# Patient Record
Sex: Male | Born: 1935 | Race: White | Hispanic: No | Marital: Married | Smoking: Former smoker
Health system: Southern US, Community
[De-identification: ages and names within clinical notes are randomized; demographics above are authoritative.]

## PROBLEM LIST (undated history)

## (undated) DIAGNOSIS — N200 Calculus of kidney: Secondary | ICD-10-CM

## (undated) DIAGNOSIS — I10 Essential (primary) hypertension: Secondary | ICD-10-CM

## (undated) DIAGNOSIS — E7800 Pure hypercholesterolemia, unspecified: Secondary | ICD-10-CM

## (undated) DIAGNOSIS — I82409 Acute embolism and thrombosis of unspecified deep veins of unspecified lower extremity: Secondary | ICD-10-CM

## (undated) DIAGNOSIS — E039 Hypothyroidism, unspecified: Secondary | ICD-10-CM

## (undated) DIAGNOSIS — H353211 Exudative age-related macular degeneration, right eye, with active choroidal neovascularization: Secondary | ICD-10-CM

## (undated) HISTORY — DX: Essential (primary) hypertension: I10

## (undated) HISTORY — DX: Exudative age-related macular degeneration, right eye, with active choroidal neovascularization: H35.3211

## (undated) HISTORY — PX: COLONOSCOPY: SHX174

## (undated) HISTORY — DX: Hypothyroidism, unspecified: E03.9

## (undated) HISTORY — PX: OTHER SURGICAL HISTORY: SHX169

## (undated) HISTORY — PX: TOTAL HIP ARTHROPLASTY: SHX124

## (undated) HISTORY — PX: CATARACT EXTRACTION W/ INTRAOCULAR LENS IMPLANT: SHX1309

## (undated) HISTORY — DX: Pure hypercholesterolemia, unspecified: E78.00

---

## 1982-02-08 HISTORY — PX: HEMORROIDECTOMY: SUR656

## 2006-02-08 DIAGNOSIS — H269 Unspecified cataract: Secondary | ICD-10-CM

## 2006-02-08 HISTORY — DX: Unspecified cataract: H26.9

## 2008-08-31 ENCOUNTER — Ambulatory Visit: Payer: Self-pay | Admitting: Vascular Surgery

## 2008-08-31 ENCOUNTER — Emergency Department (HOSPITAL_COMMUNITY): Admission: EM | Admit: 2008-08-31 | Discharge: 2008-08-31 | Payer: Self-pay | Admitting: Emergency Medicine

## 2010-05-17 LAB — APTT: aPTT: 28 seconds (ref 24–37)

## 2010-05-17 LAB — PROTIME-INR: INR: 1 (ref 0.00–1.49)

## 2015-12-30 ENCOUNTER — Emergency Department (HOSPITAL_COMMUNITY): Payer: Medicare Other

## 2015-12-30 ENCOUNTER — Emergency Department (HOSPITAL_COMMUNITY)
Admission: EM | Admit: 2015-12-30 | Discharge: 2015-12-30 | Disposition: A | Discharge status: Home / Self Care | Payer: Medicare Other | Attending: Emergency Medicine | Admitting: Emergency Medicine

## 2015-12-30 ENCOUNTER — Encounter (HOSPITAL_COMMUNITY): Payer: Self-pay

## 2015-12-30 DIAGNOSIS — W1839XA Other fall on same level, initial encounter: Secondary | ICD-10-CM | POA: Insufficient documentation

## 2015-12-30 DIAGNOSIS — S6991XA Unspecified injury of right wrist, hand and finger(s), initial encounter: Secondary | ICD-10-CM | POA: Diagnosis present

## 2015-12-30 DIAGNOSIS — Y929 Unspecified place or not applicable: Secondary | ICD-10-CM | POA: Diagnosis not present

## 2015-12-30 DIAGNOSIS — Y999 Unspecified external cause status: Secondary | ICD-10-CM | POA: Insufficient documentation

## 2015-12-30 DIAGNOSIS — Y939 Activity, unspecified: Secondary | ICD-10-CM | POA: Insufficient documentation

## 2015-12-30 DIAGNOSIS — S61411A Laceration without foreign body of right hand, initial encounter: Secondary | ICD-10-CM | POA: Diagnosis not present

## 2015-12-30 DIAGNOSIS — Z96649 Presence of unspecified artificial hip joint: Secondary | ICD-10-CM | POA: Insufficient documentation

## 2015-12-30 DIAGNOSIS — W19XXXA Unspecified fall, initial encounter: Secondary | ICD-10-CM

## 2015-12-30 HISTORY — DX: Calculus of kidney: N20.0

## 2015-12-30 HISTORY — DX: Acute embolism and thrombosis of unspecified deep veins of unspecified lower extremity: I82.409

## 2015-12-30 MED ORDER — LIDOCAINE-EPINEPHRINE 1 %-1:100000 IJ SOLN
20.0000 mL | Freq: Once | INTRAMUSCULAR | Status: AC
  Administered 2015-12-30: 20 mL via INTRADERMAL
  Filled 2015-12-30: qty 1

## 2015-12-30 MED ORDER — ACETAMINOPHEN 500 MG PO TABS
1000.0000 mg | ORAL_TABLET | Freq: Once | ORAL | Status: AC
  Administered 2015-12-30: 1000 mg via ORAL
  Filled 2015-12-30: qty 2

## 2015-12-30 NOTE — ED Triage Notes (Signed)
Per Pt, Pt is coming from home with complaints of mechanical fall that took place last night. Pt missed the last step going out of the house and hit his right shoulder and elbow. Denies hitting his head or LOC. Pt reports having right shoulder pain and right hand pain.

## 2015-12-30 NOTE — ED Notes (Signed)
Got patient ready for discharge 

## 2015-12-30 NOTE — ED Provider Notes (Signed)
[facility] DEPT Provider Note   CSN: [id number] Arrival date & time: 12/30/15  8841     History   Chief Complaint Chief Complaint  Patient presents with  . Fall    HPI Stanley Brooks is a 82 y.o. male.  82 yo M with a chief complaint of a fall. Patient thought he was on the ground but really was on the last step and lost his balance and fell onto his right side. Landed on the right lateral shoulder. Denies head injury denies loss of consciousness denies neck pain or back pain. Complaining of pain to the proximal humerus. This is nonpainful unless he moves it. Also having some tenderness to the proximal forearm. Worse with movement and palpation. He has a laceration to the palmar aspect of his right hand. Bleeding is controlled. Last tetanus was within the past couple years. Denies abdominal pain denies chest pain denies shortness of breath. Denies hip pain denies lower extremity pain.   The history is provided by the patient and the spouse.  Fall  This is a new problem. The current episode started yesterday. The problem occurs constantly. The problem has not changed since onset.Pertinent negatives include no chest pain, no abdominal pain, no headaches and no shortness of breath. Nothing aggravates the symptoms. Nothing relieves the symptoms. He has tried nothing for the symptoms. The treatment provided no relief.    Past Medical History:  Diagnosis Date  . DVT (deep venous thrombosis) ([place])   . Kidney stone     There are no active problems to display for this patient.   Past Surgical History:  Procedure Laterality Date  . Plateau repair of righ leg     . TOTAL HIP ARTHROPLASTY         Home Medications    Prior to Admission medications   Medication Sig Start Date End Date Taking? Authorizing Provider  atorvastatin (LIPITOR) 20 MG tablet Take 20 mg by mouth daily.   Yes Historical Provider, MD  lisinopril (PRINIVIL,ZESTRIL) 10 MG tablet Take 10 mg by mouth daily.   Yes  Historical Provider, MD  rivaroxaban (XARELTO) 20 MG TABS tablet Take 20 mg by mouth daily with supper.   Yes Historical Provider, MD    Family History No family history on file.  Social History Social History  Substance Use Topics  . Smoking status: Never Smoker  . Smokeless tobacco: Former Systems developer  . Alcohol use 4.2 oz/week    7 Shots of liquor per week     Allergies   Neosporin [neomycin-bacitracin zn-polymyx] and Penicillins   Review of Systems Review of Systems  Constitutional: Negative for chills and fever.  HENT: Negative for congestion and facial swelling.   Eyes: Negative for discharge and visual disturbance.  Respiratory: Negative for shortness of breath.   Cardiovascular: Negative for chest pain and palpitations.  Gastrointestinal: Negative for abdominal pain, diarrhea and vomiting.  Musculoskeletal: Positive for arthralgias and myalgias.  Skin: Positive for wound. Negative for color change and rash.  Neurological: Negative for tremors, syncope and headaches.  Psychiatric/Behavioral: Negative for confusion and dysphoric mood.     Physical Exam Updated Vital Signs BP 144/75 (BP Location: Left Arm)   Pulse 87   Temp 97.7 F (36.5 C) (Oral)   Resp 21   Ht 6\' 5"  (1.956 m)   Wt 210 lb (95.3 kg)   SpO2 99%   BMI 24.90 kg/m   Physical Exam  Constitutional: He is oriented to person, place, and time. He appears  well-developed and well-nourished.  HENT:  Head: Normocephalic and atraumatic.  Eyes: EOM are normal. Pupils are equal, round, and reactive to light.  Neck: Normal range of motion. Neck supple. No JVD present.  Cardiovascular: Normal rate and regular rhythm.  Exam reveals no gallop and no friction rub.   No murmur heard. Pulmonary/Chest: No respiratory distress. He has no wheezes.  Abdominal: He exhibits no distension. There is no rebound and no guarding.  Musculoskeletal: Normal range of motion. He exhibits tenderness.  Tenderness about the proximal  humerus pain with range of motion of the right shoulder. No noted tenderness to the clavicle or the scapula. No chest wall tenderness. Mild tenderness to the proximal ulna. Pulse motor and sensation is intact distally. No scaphoid tenderness. No midline spinal tenderness. No signs of trauma to the head.  Neurological: He is alert and oriented to person, place, and time.  Skin: No rash noted. No pallor.  2.7 cm laceration to the palmar base of the right thumb.  Psychiatric: He has a normal mood and affect. His behavior is normal.  Nursing note and vitals reviewed.    ED Treatments / Results  Labs (all labs ordered are listed, but only abnormal results are displayed) Labs Reviewed - No data to display  EKG  EKG Interpretation None       Radiology Dg Shoulder Right  Result Date: 12/30/2015 CLINICAL DATA:  [place] last night at 1800 hours, lateral and posterior RIGHT shoulder pain EXAM: RIGHT SHOULDER - 2+ VIEW COMPARISON:  None FINDINGS: Osseous demineralization. AC joint alignment normal. No acute fracture, dislocation, or bone destruction. Visualized RIGHT ribs intact. Artifact projects over the RIGHT chest on the scapular Y-view, external to the thorax on axillary view. IMPRESSION: Osseous demineralization. No acute abnormalities. Electronically Signed   By: Lavonia Dana M.D.   On: 12/30/2015 10:30   Dg Elbow Complete Right  Result Date: 12/30/2015 CLINICAL DATA:  Posterior RIGHT elbow pain after falling last night at 1800 hours EXAM: RIGHT ELBOW - COMPLETE 3+ VIEW COMPARISON:  None FINDINGS: Osseous demineralization. Narrowing of radio capitellar joint with degenerative changes. Elbow joint effusion present. Questionable slight angular deformity at the RIGHT radial neck, cannot exclude subtle radial neck fracture. Small medial epicondylar spur. No additional acute fracture, dislocation, or bone destruction. IMPRESSION: Degenerative changes RIGHT elbow joint. Joint effusion with  questionable nondisplaced radial neck fracture. Electronically Signed   By: Lavonia Dana M.D.   On: 12/30/2015 10:35    Procedures .Marland KitchenLaceration Repair Date/Time: 12/30/2015 10:16 AM Performed by: Tyrone Nine Thomasene Dubow Authorized by: Deno Etienne   Consent:    Consent obtained:  Verbal   Consent given by:  Patient   Risks discussed:  Infection, pain, poor cosmetic result, need for additional repair and poor wound healing   Alternatives discussed:  No treatment, delayed treatment and observation Anesthesia (see MAR for exact dosages):    Anesthesia method:  Local infiltration   Local anesthetic:  Lidocaine 1% WITH epi Laceration details:    Location:  Hand   Hand location:  R palm   Length (cm):  2.7 Repair type:    Repair type:  Intermediate Pre-procedure details:    Preparation:  Patient was prepped and draped in usual sterile fashion Exploration:    Hemostasis achieved with:  Direct pressure   Wound exploration: wound explored through full range of motion     Contaminated: no   Treatment:    Area cleansed with:  Soap and water   Amount of cleaning:  Extensive   Irrigation solution:  Tap water   Irrigation volume:  Tap for 5 min   Irrigation method:  Tap   Visualized foreign bodies/material removed: no   Skin repair:    Repair method:  Sutures   Suture size:  5-0   Suture material:  Nylon   Suture technique:  Simple interrupted   Number of sutures:  6 Approximation:    Approximation:  Close   Vermilion border: well-aligned   Post-procedure details:    Dressing:  Bulky dressing   Patient tolerance of procedure:  Tolerated well, no immediate complications    (including critical care time)  Medications Ordered in ED Medications  lidocaine-EPINEPHrine (XYLOCAINE W/EPI) 1 %-1:100000 (with pres) injection 20 mL (20 mLs Intradermal Given by Other 12/30/15 0935)  acetaminophen (TYLENOL) tablet 1,000 mg (1,000 mg Oral Given 12/30/15 0935)     Initial Impression / Assessment and  Plan / ED Course  I have reviewed the triage vital signs and the nursing notes.  Pertinent labs & imaging results that were available during my care of the patient were reviewed by me and considered in my medical decision making (see chart for details).  Clinical Course     82 yo M With a chief complaint of a mechanical fall. Will repair the laceration at bedside. Plain films of the right shoulder and right elbow. There is no head injury patient has no C-spine pain. Is able to rotate his head without difficulty. See no need for head or neck imaging at this time.  X ray negative, d/c home.   10:49 AM:  I have discussed the diagnosis/risks/treatment options with the patient and family and believe the pt to be eligible for discharge home to follow-up with PCP. We also discussed returning to the ED immediately if new or worsening sx occur. We discussed the sx which are most concerning (e.g., fever, redness, swelling, drainage) that necessitate immediate return. Medications administered to the patient during their visit and any new prescriptions provided to the patient are listed below.  Medications given during this visit Medications  lidocaine-EPINEPHrine (XYLOCAINE W/EPI) 1 %-1:100000 (with pres) injection 20 mL (20 mLs Intradermal Given by Other 12/30/15 0935)  acetaminophen (TYLENOL) tablet 1,000 mg (1,000 mg Oral Given 12/30/15 0935)     The patient appears reasonably screen and/or stabilized for discharge and I doubt any other medical condition or other [facility] requiring further screening, evaluation, or treatment in the ED at this time prior to discharge.    Final Clinical Impressions(s) / ED Diagnoses   Final diagnoses:  Fall, initial encounter  Laceration of right hand without foreign body, initial encounter    New Prescriptions New Prescriptions   No medications on file     Deno Etienne, DO 12/30/15 1049

## 2016-02-09 DIAGNOSIS — M7541 Impingement syndrome of right shoulder: Secondary | ICD-10-CM | POA: Insufficient documentation

## 2016-10-06 ENCOUNTER — Non-Acute Institutional Stay: Payer: Medicare Other | Admitting: Internal Medicine

## 2016-10-06 VITALS — BP 122/74 | HR 76 | Temp 98.5°F | Ht 73.0 in | Wt 205.0 lb

## 2016-10-06 DIAGNOSIS — E039 Hypothyroidism, unspecified: Secondary | ICD-10-CM

## 2016-10-06 DIAGNOSIS — I10 Essential (primary) hypertension: Secondary | ICD-10-CM | POA: Insufficient documentation

## 2016-10-06 DIAGNOSIS — I82402 Acute embolism and thrombosis of unspecified deep veins of left lower extremity: Secondary | ICD-10-CM | POA: Diagnosis not present

## 2016-10-06 DIAGNOSIS — E785 Hyperlipidemia, unspecified: Secondary | ICD-10-CM | POA: Insufficient documentation

## 2016-10-06 DIAGNOSIS — H9113 Presbycusis, bilateral: Secondary | ICD-10-CM | POA: Diagnosis not present

## 2016-10-06 DIAGNOSIS — E7800 Pure hypercholesterolemia, unspecified: Secondary | ICD-10-CM | POA: Diagnosis not present

## 2016-10-06 NOTE — Progress Notes (Signed)
Provider:  Rexene Edison. Mariea Clonts, D.O., C.M.D. Location:  Occupational psychologist of Service:  Clinic (12)  Previous PCP: Gayland Curry, DO Patient Care Team: Gayland Curry DO as PCP - General (Geriatric Medicine)  Extended Emergency Contact Information Primary Emergency Contact: [facility] Phone: [phone] Relation: None  Code Status: DNR Goals of Care: Advanced Directive information Advanced Directives 10/06/2016  Does Patient Have a Medical Advance Directive? Yes  Type of Paramedic of [place];Living will  Copy of [place] in Chart? No - copy requested   Chief Complaint  Patient presents with  . Medical Management of Chronic Issues    New Patient, moved to [facility] age    HPI: Patient is a 83 y.o. Brooks seen today to establish with [facility].  Records have been requested from his previous PCP.     He has a h/o HTN, hyperlipidemia, DVT, kidney stone and hypothyroidism.    DVT:  In left leg 2010. Was on blood thinner for 6 mos.  Taken off and recurred.  On blood thinner since.Takes xarelto at supper with his bp pill.  Broke his hip 3-4 years ago in [place] and had that repaired.   20 years ago, broke his leg.   Had hemorrhoid surgery 20-30 years ago.    He moved to [facility] just two weeks ago.  He's a retired Chief Executive Officer.  His wife passed in 2007 and he met his current wife in 2010.  He's been back and forth to [place].    He had pneumonia as a child back pre-antibiotic use.    Hypothyroidism:  On levothyroxine 157mg at bedtime.  He says he'll move it to the morning.    [facility]    Hyperlipidemia on lipitor.     Last labs around January of this year.  Was being seen q 6 mos.    Did smoke in the past.  Has a lot of drainage and dry throat when he smoked.  He was able to kick the habit.    HOH:  Wears bilateral hearing  aids.  They help but not perfect.  Initial pair in 2008 and latest pair since about 2011.  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    Wears reading glasses since cataract surgery.  Had been nearsighted before that and wore glasses.  Takes preservision areds--did not mention macular degeneration but does see ophtho annually.  [place]      Has tried viagra, but ineffective.    He's had some prostate problems, but not recently.  He had an infection at one time.  That was 30 years ago.  He does get up more often to urinate at night then he once did.  Slightly slow, but not bothersome.  He's had a number of benign polyps on cscopes.  No longer getting those.  There was an episode of bleeding several years ago but no problem found.    Kidney stone:  Two different occasions.  2008 and 3-4 years before that.    Tries to walk some every day.  Has been to the fitness center using the machines, but has not gotten that involved so far.    Need Dr. FLeeanne Riorecords for immunizations.  Past Medical History:  Diagnosis Date  . [place]   .  Hypercholesterolemia   . Hypertension   . Hypothyroid   . Kidney stone    Past Surgical History:  Procedure Laterality Date  . CATARACT EXTRACTION W/ INTRAOCULAR LENS IMPLANT Bilateral   . COLONOSCOPY     Dr. Alvia Grove, prior polyps, but last due to age  . HEMORROIDECTOMY  1984   Dr. Shawnie Dapper  . Plateau repair of right leg  Right   . TOTAL HIP ARTHROPLASTY      Social History   Social History  . Marital status: Married    Spouse name: N/A  . Number of children: N/A  . Years of education: N/A   Occupational History  . lawyer    Social History Main Topics  . Smoking status: Former Smoker    Years: 23.00    Types: Cigarettes    Quit date: 02/09/1968  . Smokeless tobacco: Never Used  . Alcohol use 4.Stanley oz/week    7 Shots of liquor per week  . Drug use: No  .  Sexual activity: Not Asked   Other Topics Concern  . None   Social History Narrative   Married second wife [name], married 04-19-08. First wife died 04/19/05   Former smoker -stopped in Apr 19, 1968, smoked for 23 years   Alcohol one daily liquor   Caffeine 1-Stanley cups daily    Exercise golf, and walk       reports that he quit smoking about 48 years ago. His smoking use included Cigarettes. He quit after 23.00 years of use. He has never used smokeless tobacco. He reports that he drinks about 4.Stanley oz of alcohol per week . He reports that he does not use drugs.  Functional Status Survey:  independent in all ADLs  Family History  Problem Relation Age of Onset  . Aortic aneurysm Father     Health Maintenance  Topic Date Due  . TETANUS/TDAP  05/11/1946  . PNA vac Low Risk Adult (1 of Stanley - PCV13) 05/10/1992  . INFLUENZA VACCINE  09/08/2016    Allergies  Allergen Reactions  . Neosporin [Neomycin-Bacitracin Zn-Polymyx] Dermatitis  . Penicillins     Allergies as of 10/06/2016      Reactions   Neosporin [neomycin-bacitracin Zn-polymyx] Dermatitis   Penicillins       Medication List       Accurate as of 10/06/16  Stanley:36 PM. Always use your most recent med list.          atorvastatin 20 MG tablet Commonly known as:  LIPITOR Take 20 mg by mouth daily.   levothyroxine 100 MCG tablet Commonly known as:  SYNTHROID, LEVOTHROID Take by mouth. Take one tablet daily   lisinopril 10 MG tablet Commonly known as:  PRINIVIL,ZESTRIL Take 10 mg by mouth daily.   PRESERVISION AREDS PO Take by mouth. Take one tablet twice daily for vision   rivaroxaban 20 MG Tabs tablet Commonly known as:  XARELTO Take 20 mg by mouth daily with supper.            Discharge Care Instructions        Start     Ordered   10/06/16 0000  Do not attempt resuscitation (DNR)    Comments:  Discussed at clinic visit, scanned copy should be in documents and media  Question Answer Comment  In the event of cardiac or  respiratory ARREST Do not call a "code blue"   In the event of cardiac or respiratory ARREST Do not perform Intubation, CPR, defibrillation or ACLS   In  the event of cardiac or respiratory ARREST Use medication by any route, position, wound care, and other measures to relive pain and suffering. May use oxygen, suction and manual treatment of airway obstruction as needed for comfort.      10/06/16 1434      Review of Systems  Constitutional: Negative for chills, fever and malaise/fatigue.  HENT: Positive for hearing loss. Negative for congestion.        Hearing aids bilaterally, cerumen impaction left ear  Eyes: Negative for blurred vision.       Reading glasses  Respiratory: Negative for cough and shortness of breath.   Cardiovascular: Negative for chest pain, palpitations and leg swelling.  Gastrointestinal: Negative for abdominal pain, blood in stool, constipation, diarrhea and melena.  Genitourinary: Negative for dysuria, frequency and urgency.       Some nocturia and mild difficulty starting stream  Musculoskeletal: Negative for falls.  Skin: Negative for itching and rash.  Neurological: Negative for dizziness, tingling, sensory change, loss of consciousness and weakness.  Endo/Heme/Allergies: Bruises/bleeds easily.  Psychiatric/Behavioral: Negative for depression and memory loss. The patient is not nervous/anxious and does not have insomnia.        Notes some difficulty remembering names    Vitals:   10/06/16 1320  BP: 122/74  Pulse: 76  Temp: 98.5 F (36.9 C)  TempSrc: Oral  SpO2: 95%  Weight: 205 lb (93 kg)  Height: 6' 1"  (1.854 m)   Body mass index is 27.05 kg/m. Physical Exam  Constitutional: He is oriented to person, place, and time. He appears well-developed and well-nourished. No distress.  HENT:  Head: Normocephalic and atraumatic.  Right Ear: External ear normal.  Left Ear: External ear normal.  Nose: Nose normal.  Mouth/Throat: Oropharynx is clear and  moist. No oropharyngeal exudate.  Eyes: Pupils are equal, round, and reactive to light. Conjunctivae and EOM are normal.  Neck: Normal range of motion. Neck supple. No JVD present.  Cardiovascular: Normal rate, regular rhythm, normal heart sounds and intact distal pulses.   Pulmonary/Chest: Effort normal and breath sounds normal. No respiratory distress.  Abdominal: Soft. Bowel sounds are normal. He exhibits no distension. There is no tenderness.  Musculoskeletal: Normal range of motion. He exhibits no tenderness.  Lymphadenopathy:    He has no cervical adenopathy.  Neurological: He is alert and oriented to person, place, and time. He displays normal reflexes. No cranial nerve deficit.  Skin: Skin is warm and dry. Capillary refill takes less than Stanley seconds.  Psychiatric: He has a normal mood and affect. His behavior is normal. Judgment and thought content normal.    Labs reviewed: Basic Metabolic Panel: No results for input(s): NA, K, CL, CO2, GLUCOSE, BUN, CREATININE, CALCIUM, MG, PHOS in the last 8760 hours. Liver Function Tests: No results for input(s): AST, ALT, ALKPHOS, BILITOT, PROT, ALBUMIN in the last 8760 hours. No results for input(s): LIPASE, AMYLASE in the last 8760 hours. No results for input(s): AMMONIA in the last 8760 hours. CBC: No results for input(s): WBC, NEUTROABS, HGB, HCT, MCV, PLT in the last 8760 hours. Cardiac Enzymes: No results for input(s): CKTOTAL, CKMB, CKMBINDEX, TROPONINI in the last 8760 hours. BNP: Invalid input(s): POCBNP No results found for: HGBA1C No results found for: TSH No results found for: VITAMINB12 No results found for: FOLATE No results found for: IRON, TIBC, FERRITIN  Imaging and Procedures reviewed on new patient packet.  Attempted to review labs and studies from care everywhere--could not see much  Assessment/Plan 1.  Essential hypertension -bp at goal with lisinopril, cont to monitor  Stanley. Pure hypercholesterolemia -last labs in  Jan '18, but I don't have them--will get new labs and have requested records  3. DVT, lower extremity, recurrent, left ([place]) -had two separate occurrences in left leg -on xarelto now for this  4. Acquired hypothyroidism -cont levothyroxine, but begin taking first thing in am on empty stomach  5. 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 Keishia Ground, D.O. [facility] [address], [place] [address] Cell Phone (Mon-Fri 8am-5pm):  [phone] On Call:  [phone] & follow prompts after 5pm & weekends Office Phone:  [phone] Office Fax:  [phone]

## 2016-10-19 ENCOUNTER — Encounter: Payer: Self-pay | Admitting: Internal Medicine

## 2016-10-19 LAB — LIPID PANEL
Cholesterol: 181 (ref 0–200)
HDL: 39 (ref 35–70)
LDL Cholesterol: 114
Triglycerides: 140 (ref 40–160)

## 2016-10-19 LAB — HEPATIC FUNCTION PANEL
ALT: 18 (ref 10–40)
AST: 22 (ref 14–40)
Alkaline Phosphatase: 94 (ref 25–125)
Bilirubin, Total: 0.5

## 2016-10-19 LAB — TSH: TSH: 2.25 (ref 0.41–5.90)

## 2016-10-19 LAB — BASIC METABOLIC PANEL
BUN: 22 — AB (ref 4–21)
Creatinine: 1.7 — AB (ref 0.6–1.3)
Glucose: 95
Potassium: 4.4 (ref 3.4–5.3)
Sodium: 140 (ref 137–147)

## 2016-10-19 LAB — CBC AND DIFFERENTIAL
HCT: 45 (ref 41–53)
Hemoglobin: 15.6 (ref 13.5–17.5)
Platelets: 154 (ref 150–399)
WBC: 4.2

## 2017-01-19 ENCOUNTER — Non-Acute Institutional Stay: Payer: Medicare Other | Admitting: Internal Medicine

## 2017-01-19 ENCOUNTER — Encounter: Payer: Self-pay | Admitting: Internal Medicine

## 2017-01-19 VITALS — BP 138/80 | HR 72 | Temp 98.1°F | Wt 200.0 lb

## 2017-01-19 DIAGNOSIS — H9113 Presbycusis, bilateral: Secondary | ICD-10-CM | POA: Diagnosis not present

## 2017-01-19 DIAGNOSIS — E7800 Pure hypercholesterolemia, unspecified: Secondary | ICD-10-CM | POA: Diagnosis not present

## 2017-01-19 DIAGNOSIS — E039 Hypothyroidism, unspecified: Secondary | ICD-10-CM | POA: Diagnosis not present

## 2017-01-19 DIAGNOSIS — Z23 Encounter for immunization: Secondary | ICD-10-CM | POA: Diagnosis not present

## 2017-01-19 DIAGNOSIS — I10 Essential (primary) hypertension: Secondary | ICD-10-CM | POA: Diagnosis not present

## 2017-01-19 DIAGNOSIS — J209 Acute bronchitis, unspecified: Secondary | ICD-10-CM | POA: Diagnosis not present

## 2017-01-19 MED ORDER — BENZONATATE 100 MG PO CAPS: 100.0000 mg | ORAL_CAPSULE | Freq: Two times a day (BID) | ORAL | 0 refills | Status: DC | PRN

## 2017-01-19 MED ORDER — DOXYCYCLINE HYCLATE 100 MG PO TABS: 100.0000 mg | ORAL_TABLET | Freq: Two times a day (BID) | ORAL | 0 refills | Status: DC

## 2017-01-19 NOTE — Progress Notes (Signed)
Location:  Occupational psychologist of Service:  Clinic (12)  Provider: Mardi Cannady L. Mariea Clonts, D.O., C.M.D.  Code Status: DNR Goals of Care:  Advanced Directives 01/19/2017  Does Patient Have a Medical Advance Directive? Yes  Type of Paramedic of [place];Out of facility DNR (pink MOST or yellow form)  Does patient want to make changes to medical advance directive? No - Patient declined  Copy of [place] in Chart? Yes  Pre-existing out of facility DNR order (yellow form or pink MOST form) Yellow form placed in chart (order not valid for inpatient use)   Chief Complaint  Patient presents with  . Medical Management of Chronic Issues    69mth follow-up    HPI: Patient is a 83 y.o. male seen today for medical management of chronic diseases.    He is adjusting well to [facility].  He is happy here. He is doing chair 2 exercise program.  Doing reading in [employer].  Likes to play bridge, but has not yet played here. He plays in local tournaments.  Says there are more women than men.    About two weeks, he's had congestion.  Hasn't felt bad like a cold.  Still congested.  A little better today.  Say she's not had success with otc meds.  Has not taken anything so far.  Some cough, but more from the head congestion.  No fever or chills.  No sore throat.  No headache.  A fullness feeling in his head.    BP at goal today.  On lisinopril.  Hypothyroidism:  On levothyroxine.  Takes properly.   Lab Results  Component Value Date   TSH 2.25 10/19/2016   Hyperlipidemia:  On lipitor.   Lab Results  Component Value Date   CHOL 181 10/19/2016   HDL 39 10/19/2016   LDLCALC 114 10/19/2016   TRIG 140 10/19/2016   Agrees to pneumonia vaccines, but says he's never had them before.  Past Medical History:  Diagnosis Date  . DVT (deep venous thrombosis) ([place])   . Hypercholesterolemia   . Hypertension   . Hypothyroid   . Kidney stone       Past Surgical History:  Procedure Laterality Date  . CATARACT EXTRACTION W/ INTRAOCULAR LENS IMPLANT Bilateral   . COLONOSCOPY     Dr. Alvia Grove, prior polyps, but last due to age  . HEMORROIDECTOMY  1984   Dr. Shawnie Dapper  . Plateau repair of right leg  Right   . TOTAL HIP ARTHROPLASTY      Allergies  Allergen Reactions  . Neosporin [Neomycin-Bacitracin Zn-Polymyx] Dermatitis  . Penicillins     Outpatient Encounter Medications as of 01/19/2017  Medication Sig  . atorvastatin (LIPITOR) 20 MG tablet Take 20 mg by mouth daily.  Marland Kitchen levothyroxine (SYNTHROID, LEVOTHROID) 100 MCG tablet Take by mouth. Take one tablet daily  . lisinopril (PRINIVIL,ZESTRIL) 10 MG tablet Take 10 mg by mouth daily.  . Multiple Vitamins-Minerals (PRESERVISION AREDS PO) Take by mouth. Take one tablet twice daily for vision  . rivaroxaban (XARELTO) 20 MG TABS tablet Take 20 mg by mouth daily with supper.   No facility-administered encounter medications on file as of 01/19/2017.     Review of Systems:  Review of Systems  Constitutional: Negative for chills, fever and malaise/fatigue.  HENT: Positive for congestion and hearing loss. Negative for sinus pain and sore throat.        Pressure in sinuses  Respiratory: Positive for  cough and sputum production. Negative for shortness of breath.   Cardiovascular: Negative for chest pain, palpitations and leg swelling.  Gastrointestinal: Negative for abdominal pain and diarrhea.  Musculoskeletal: Negative for falls.  Neurological: Positive for dizziness. Negative for weakness.  Psychiatric/Behavioral: Negative for depression and memory loss.    Health Maintenance  Topic Date Due  . TETANUS/TDAP  05/11/1944  . PNA vac Low Risk Adult (1 of 2 - PCV13) 05/10/1992  . INFLUENZA VACCINE  Completed    Physical Exam: Vitals:   01/19/17 0913  BP: 138/80  Pulse: 72  Temp: 98.1 F (36.7 C)  TempSrc: Oral  SpO2: 94%  Weight: 200 lb (90.7 kg)   Body mass index is  26.39 kg/m. Physical Exam  Constitutional: He is oriented to person, place, and time. He appears well-developed. No distress.  Cardiovascular: Normal rate, regular rhythm, normal heart sounds and intact distal pulses.  Pulmonary/Chest: Effort normal. No respiratory distress.  Coarse wet rhonchi, wet cough  Abdominal: Bowel sounds are normal.  Musculoskeletal: Normal range of motion.  Gait unsteady, swaying to the right on standing  Neurological: He is alert and oriented to person, place, and time.  Skin: Skin is warm and dry.  seborrhea  Psychiatric: He has a normal mood and affect.    Labs reviewed: Basic Metabolic Panel: Recent Labs    10/19/16  NA 140  K 4.4  BUN 22*  CREATININE 1.7*  TSH 2.25   Liver Function Tests: Recent Labs    10/19/16 0700  AST 22  ALT 18  ALKPHOS 94   No results for input(s): LIPASE, AMYLASE in the last 8760 hours. No results for input(s): AMMONIA in the last 8760 hours. CBC: Recent Labs    10/19/16  WBC 4.2  HGB 15.6  HCT 45  PLT 154   Lipid Panel: Recent Labs    10/19/16  CHOL 181  HDL 39  LDLCALC 114  TRIG 140   Assessment/Plan 1. Acute bronchitis, unspecified organism - conservative measures as in AVS instructions - doxycycline (VIBRA-TABS) 100 MG tablet; Take 1 tablet (100 mg total) by mouth 2 (two) times daily.  Dispense: 20 tablet; Refill: 0 -tessalon perles if cough keeping up at night  2. Essential hypertension -bp at goal with current regimen, avoid D products that will worsen this  3. Pure hypercholesterolemia -cont lipitor therapy, LDL satisfactory, no KNOWN CAD  4. Acquired hypothyroidism -TSH  Was at goal in sept, cont same levothyroxine  5. Presbycusis of both ears -cont hearing aids, regular debrox to flush ears  6. Need for vaccination against Streptococcus pneumoniae using pneumococcal conjugate vaccine 13 -prevnar given  Labs/tests ordered:   Orders Placed This Encounter  Procedures  .  Pneumococcal conjugate vaccine 13-valent IM    Next appt:  05/25/2017 med mgt, MMSE  Jaylnn Ullery L. Guenevere Roorda, D.O. [facility] [address], [place] [address] Cell Phone (Mon-Fri 8am-5pm):  [phone] On Call:  [phone] & follow prompts after 5pm & weekends Office Phone:  [phone] Office Fax:  [phone]

## 2017-01-19 NOTE — Patient Instructions (Addendum)
Drink 6-8 8oz glasses of water per day. Increase vitamin C intake.   Use warm humidity like a humidifier, vaporizer or hot shower to loosen your mucus. Try mucinex 12 hour to help get the mucus up and out of your bronchi.   Eat yogurt daily while on antibiotics or buy an over the counter probiotic like florastor or align to prevent killing off good bacteria in the bowels. Take doxycycline 100mg  po bid for 10 days.  Acute Bronchitis, Adult Acute bronchitis is sudden (acute) swelling of the air tubes (bronchi) in the lungs. Acute bronchitis causes these tubes to fill with mucus, which can make it hard to breathe. It can also cause coughing or wheezing. In adults, acute bronchitis usually goes away within 2 weeks. A cough caused by bronchitis may last up to 3 weeks. Smoking, allergies, and asthma can make the condition worse. Repeated episodes of bronchitis may cause further lung problems, such as chronic obstructive pulmonary disease (COPD). What are the causes? This condition can be caused by germs and by substances that irritate the lungs, including:  Cold and flu viruses. This condition is most often caused by the same virus that causes a cold.  Bacteria.  Exposure to tobacco smoke, dust, fumes, and air pollution.  What increases the risk? This condition is more likely to develop in people who:  Have close contact with someone with acute bronchitis.  Are exposed to lung irritants, such as tobacco smoke, dust, fumes, and vapors.  Have a weak immune system.  Have a respiratory condition such as asthma.  What are the signs or symptoms? Symptoms of this condition include:  A cough.  Coughing up clear, yellow, or green mucus.  Wheezing.  Chest congestion.  Shortness of breath.  A fever.  Body aches.  Chills.  A sore throat.  How is this diagnosed? This condition is usually diagnosed with a physical exam. During the exam, your health care provider may order tests, such  as chest X-rays, to rule out other conditions. He or she may also:  Test a sample of your mucus for bacterial infection.  Check the level of oxygen in your blood. This is done to check for pneumonia.  Do a chest X-ray or lung function testing to rule out pneumonia and other conditions.  Perform blood tests.  Your health care provider will also ask about your symptoms and medical history. How is this treated? Most cases of acute bronchitis clear up over time without treatment. Your health care provider may recommend:  Drinking more fluids. Drinking more makes your mucus thinner, which may make it easier to breathe.  Taking a medicine for a fever or cough.  Taking an antibiotic medicine.  Using an inhaler to help improve shortness of breath and to control a cough.  Using a cool mist vaporizer or humidifier to make it easier to breathe.  Follow these instructions at home: Medicines  Take over-the-counter and prescription medicines only as told by your health care provider.  If you were prescribed an antibiotic, take it as told by your health care provider. Do not stop taking the antibiotic even if you start to feel better. General instructions  Get plenty of rest.  Drink enough fluids to keep your urine clear or pale yellow.  Avoid smoking and secondhand smoke. Exposure to cigarette smoke or irritating chemicals will make bronchitis worse. If you smoke and you need help quitting, ask your health care provider. Quitting smoking will help your lungs heal faster.  Use an inhaler, cool mist vaporizer, or humidifier as told by your health care provider.  Keep all follow-up visits as told by your health care provider. This is important. How is this prevented? To lower your risk of getting this condition again:  Wash your hands often with soap and water. If soap and water are not available, use hand sanitizer.  Avoid contact with people who have cold symptoms.  Try not to touch  your hands to your mouth, nose, or eyes.  Make sure to get the flu shot every year.  Contact a health care provider if:  Your symptoms do not improve in 2 weeks of treatment. Get help right away if:  You cough up blood.  You have chest pain.  You have severe shortness of breath.  You become dehydrated.  You faint or keep feeling like you are going to faint.  You keep vomiting.  You have a severe headache.  Your fever or chills gets worse. This information is not intended to replace advice given to you by your health care provider. Make sure you discuss any questions you have with your health care provider. Document Released: 03/04/2004 Document Revised: 08/20/2015 Document Reviewed: 07/16/2015 Elsevier Interactive Patient Education  2017 [employer].

## 2017-02-09 ENCOUNTER — Other Ambulatory Visit: Payer: Self-pay | Admitting: *Deleted

## 2017-02-09 MED ORDER — LEVOTHYROXINE SODIUM 100 MCG PO TABS: 100.0000 ug | ORAL_TABLET | Freq: Every day | ORAL | 0 refills | Status: DC

## 2017-04-20 ENCOUNTER — Other Ambulatory Visit: Payer: Self-pay | Admitting: Internal Medicine

## 2017-05-17 ENCOUNTER — Ambulatory Visit (INDEPENDENT_AMBULATORY_CARE_PROVIDER_SITE_OTHER): Payer: Medicare Other | Admitting: Nurse Practitioner

## 2017-05-17 ENCOUNTER — Encounter: Payer: Self-pay | Admitting: Nurse Practitioner

## 2017-05-17 VITALS — BP 124/82 | HR 70 | Temp 97.9°F | Ht 73.0 in | Wt 201.0 lb

## 2017-05-17 DIAGNOSIS — L089 Local infection of the skin and subcutaneous tissue, unspecified: Secondary | ICD-10-CM

## 2017-05-17 DIAGNOSIS — T148XXA Other injury of unspecified body region, initial encounter: Secondary | ICD-10-CM | POA: Diagnosis not present

## 2017-05-17 MED ORDER — DOXYCYCLINE HYCLATE 100 MG PO TABS: 100.0000 mg | ORAL_TABLET | Freq: Two times a day (BID) | ORAL | 0 refills | Status: DC

## 2017-05-17 NOTE — Patient Instructions (Addendum)
To start doxycycline 100 mg by mouth twice daily for 7 days  Take with food to avoid GI upset May use probiotic to help prevent GI upset as well  To use vaseline gauze/xeroform and then cover with 4x4 and gauze, to change daily and as needed   Follow up with Dr Mariea Clonts as scheduled

## 2017-05-17 NOTE — Progress Notes (Signed)
Careteam: Patient Care Team: Gayland Curry, DO as PCP - General (Geriatric Medicine)  Advanced Directive information    Allergies  Allergen Reactions  . Neosporin [Neomycin-Bacitracin Zn-Polymyx] Dermatitis  . Penicillins     Chief Complaint  Patient presents with  . Acute Visit    Pt is being seen due to left elbow wound that is infected. Wound occurred during a fall 4 days ago.   . Other    Pt wife in room      HPI: Patient is a 84 y.o. male seen in the office today to evaluate left elbow wound for possible infection.  Fell 4 days ago while carrying workout equipment landed on elbow which resulted in laceration. Has been using peroxide to clean wound Last 2 days with increase in drainage. Bloody/pink drainage. No increase in pain Noted to be swollen, warm and red around laceration and nurse at facility concerned over infection.  No fevers or chills.   Review of Systems:  Review of Systems  Constitutional: Negative for chills, fever and malaise/fatigue.  Respiratory: Negative for shortness of breath.   Cardiovascular: Negative for palpitations.  Skin: Negative for itching and rash.       Laceration after falling    Past Medical History:  Diagnosis Date  . DVT (deep venous thrombosis) ([place])   . Hypercholesterolemia   . Hypertension   . Hypothyroid   . Kidney stone    Past Surgical History:  Procedure Laterality Date  . CATARACT EXTRACTION W/ INTRAOCULAR LENS IMPLANT Bilateral   . COLONOSCOPY     Dr. Alvia Grove, prior polyps, but last due to age  . HEMORROIDECTOMY  1984   Dr. Shawnie Dapper  . Plateau repair of right leg  Right   . TOTAL HIP ARTHROPLASTY     Social History:   reports that he quit smoking about 49 years ago. His smoking use included cigarettes. He quit after 23.00 years of use. He has never used smokeless tobacco. He reports that he drinks about 4.2 oz of alcohol per week. He reports that he does not use drugs.  Family History  Problem Relation  Age of Onset  . Aortic aneurysm Father     Medications: Patient's Medications  New Prescriptions   No medications on file  Previous Medications   ATORVASTATIN (LIPITOR) 20 MG TABLET    TAKE 1 TABLET BY MOUTH EACH NIGHT AT BEDTIME   LEVOTHYROXINE (SYNTHROID, LEVOTHROID) 100 MCG TABLET    Take 1 tablet (100 mcg total) by mouth daily before breakfast. Take one tablet daily   LISINOPRIL (PRINIVIL,ZESTRIL) 10 MG TABLET    Take 10 mg by mouth daily.   MULTIPLE VITAMINS-MINERALS (PRESERVISION AREDS PO)    Take by mouth. Take one tablet twice daily for vision   RIVAROXABAN (XARELTO) 20 MG TABS TABLET    Take 20 mg by mouth daily with supper.  Modified Medications   No medications on file  Discontinued Medications   BENZONATATE (TESSALON) 100 MG CAPSULE    Take 1 capsule (100 mg total) by mouth 2 (two) times daily as needed for cough.   DOXYCYCLINE (VIBRA-TABS) 100 MG TABLET    Take 1 tablet (100 mg total) by mouth 2 (two) times daily.     Physical Exam:  Vitals:   05/17/17 1544  BP: 124/82  Pulse: 70  Temp: 97.9 F (36.6 C)  TempSrc: Oral  SpO2: 98%  Weight: 201 lb (91.2 kg)  Height: 6\' 1"  (1.854 m)   Body mass  index is 26.52 kg/m.  Physical Exam  Constitutional: He appears well-developed and well-nourished.  Cardiovascular: Normal rate and regular rhythm.  Pulmonary/Chest: Effort normal and breath sounds normal.  Musculoskeletal: Normal range of motion.       Left elbow: He exhibits swelling, effusion and laceration. He exhibits normal range of motion. No tenderness found.  2 cm laceration noted, serous drainage noted Redness and heat surrounding laceration on left elbow     Labs reviewed: Basic Metabolic Panel: Recent Labs    10/19/16  NA 140  K 4.4  BUN 22*  CREATININE 1.7*  TSH 2.25   Liver Function Tests: Recent Labs    10/19/16 0700  AST 22  ALT 18  ALKPHOS 94   No results for input(s): LIPASE, AMYLASE in the last 8760 hours. No results for input(s):  AMMONIA in the last 8760 hours. CBC: Recent Labs    10/19/16  WBC 4.2  HGB 15.6  HCT 45  PLT 154   Lipid Panel: Recent Labs    10/19/16  CHOL 181  HDL 39  LDLCALC 114  TRIG 140   TSH: Recent Labs    10/19/16  TSH 2.25   A1C: No results found for: HGBA1C   Assessment/Plan 1. Infected laceration -elbow with effusion, non-tender with serous drainage but redness and heat surrounding elbow - doxycycline (VIBRA-TABS) 100 MG tablet; Take 1 tablet (100 mg total) by mouth 2 (two) times daily.  Dispense: 14 tablet; Refill: 0 -not to use peroxide to wound -xeroform applied in office to avoid dressing sticking to wound, can also use Vaseline gauze and to use 4 x 4 and curlex   Next appt: 1 week with Dr Mariea Clonts, sooner if needed [place] [place], [facility] [phone]

## 2017-05-25 ENCOUNTER — Non-Acute Institutional Stay: Payer: Medicare Other | Admitting: Internal Medicine

## 2017-05-25 ENCOUNTER — Encounter: Payer: Self-pay | Admitting: Internal Medicine

## 2017-05-25 VITALS — BP 138/60 | HR 80 | Temp 98.6°F | Ht 73.0 in | Wt 200.0 lb

## 2017-05-25 DIAGNOSIS — E039 Hypothyroidism, unspecified: Secondary | ICD-10-CM | POA: Diagnosis not present

## 2017-05-25 DIAGNOSIS — L089 Local infection of the skin and subcutaneous tissue, unspecified: Secondary | ICD-10-CM

## 2017-05-25 DIAGNOSIS — I82402 Acute embolism and thrombosis of unspecified deep veins of left lower extremity: Secondary | ICD-10-CM | POA: Diagnosis not present

## 2017-05-25 DIAGNOSIS — L03114 Cellulitis of left upper limb: Secondary | ICD-10-CM | POA: Diagnosis not present

## 2017-05-25 DIAGNOSIS — W19XXXA Unspecified fall, initial encounter: Secondary | ICD-10-CM | POA: Diagnosis not present

## 2017-05-25 DIAGNOSIS — T148XXA Other injury of unspecified body region, initial encounter: Secondary | ICD-10-CM | POA: Diagnosis not present

## 2017-05-25 DIAGNOSIS — I10 Essential (primary) hypertension: Secondary | ICD-10-CM | POA: Diagnosis not present

## 2017-05-25 MED ORDER — DOXYCYCLINE HYCLATE 100 MG PO TABS: 100.0000 mg | ORAL_TABLET | Freq: Two times a day (BID) | ORAL | 0 refills | Status: DC

## 2017-05-25 NOTE — Progress Notes (Signed)
Location:  Occupational psychologist of Service:  Clinic (12)  Provider: Marcianna Daily L. Mariea Clonts, D.O., C.M.D.  Code Status: DNR Goals of Care:  Advanced Directives 05/25/2017  Does Patient Have a Medical Advance Directive? Yes  Type of Paramedic of [place];Living will;Out of facility DNR (pink MOST or yellow form)  Does patient want to make changes to medical advance directive? No - Patient declined  Copy of [place] in Chart? Yes  Pre-existing out of facility DNR order (yellow form or pink MOST form) Yellow form placed in chart (order not valid for inpatient use)   Chief Complaint  Patient presents with  . Medical Management of Chronic Issues    80mth follow-up, look at left arm after fall    HPI: Patient is a 84 y.o. male seen today for medical management of chronic diseases.    Pt had a fall on 4/5 after his exercise class.  He was putting away the equipment and chair he used (hands full) and he turned toward the left and fell onto his left elbow.  He sustained a 1 inch laceration.  When he was seen in the office 4/9 by NP Eubanks, his elbow was having increased pink, bloody drainage for 2 days.  He'd been cleaning it with peroxide.  It was also notably swollen and red around it so clinic nurse saw him and referred him for an appt.  NP had prescribed a 7 day course of doxycycline and daily dressing changes.  He was doing better with less redness and swelling of the arm by his report, but took his last dose of medication last night.  Then, this morning, there was some red streaking in his medial upper arm and redness and warmth around the elbow were back.  He also notes that his watch had become tighter, then got looser while on abx and a little tighter again today.  He had one day of chills but is not sure which day.    He admits to poor fluid intake b/c I reviewed with him some elevation of his BUN/Cr on the labs he had in September.     Past Medical History:  Diagnosis Date  . DVT (deep venous thrombosis) ([place])   . Hypercholesterolemia   . Hypertension   . Hypothyroid   . Kidney stone     Past Surgical History:  Procedure Laterality Date  . CATARACT EXTRACTION W/ INTRAOCULAR LENS IMPLANT Bilateral   . COLONOSCOPY     Dr. Alvia Grove, prior polyps, but last due to age  . HEMORROIDECTOMY  1984   Dr. Shawnie Dapper  . Plateau repair of right leg  Right   . TOTAL HIP ARTHROPLASTY      Allergies  Allergen Reactions  . Neosporin [Neomycin-Bacitracin Zn-Polymyx] Dermatitis  . Penicillins     Outpatient Encounter Medications as of 05/25/2017  Medication Sig  . atorvastatin (LIPITOR) 20 MG tablet TAKE 1 TABLET BY MOUTH EACH NIGHT AT BEDTIME  . levothyroxine (SYNTHROID, LEVOTHROID) 100 MCG tablet Take 1 tablet (100 mcg total) by mouth daily before breakfast. Take one tablet daily  . lisinopril (PRINIVIL,ZESTRIL) 10 MG tablet Take 10 mg by mouth daily.  . Multiple Vitamins-Minerals (PRESERVISION AREDS PO) Take by mouth. Take one tablet twice daily for vision  . rivaroxaban (XARELTO) 20 MG TABS tablet Take 20 mg by mouth daily with supper.  . [DISCONTINUED] doxycycline (VIBRA-TABS) 100 MG tablet Take 1 tablet (100 mg total) by mouth 2 (two)  times daily.   No facility-administered encounter medications on file as of 05/25/2017.     Review of Systems:  Review of Systems  Constitutional: Positive for chills. Negative for fever and malaise/fatigue.  HENT: Positive for hearing loss.   Eyes: Negative for blurred vision.  Respiratory: Negative for shortness of breath.   Cardiovascular: Negative for chest pain, palpitations and leg swelling.  Gastrointestinal: Negative for abdominal pain.  Genitourinary: Negative for dysuria.  Musculoskeletal: Positive for falls and joint pain.       Left elbow pain mild except right at laceration  Neurological: Negative for dizziness and loss of consciousness.  Psychiatric/Behavioral: Negative  for depression and memory loss.    Health Maintenance  Topic Date Due  . TETANUS/TDAP  05/11/1944  . INFLUENZA VACCINE  09/08/2017  . PNA vac Low Risk Adult (2 of 2 - PPSV23) 01/19/2018    Physical Exam: Vitals:   05/25/17 0912  BP: 138/60  Pulse: 80  Temp: 98.6 F (37 C)  TempSrc: Oral  SpO2: 97%  Weight: 200 lb (90.7 kg)  Height: 6\' 1"  (1.854 m)   Body mass index is 26.39 kg/m. Physical Exam  Constitutional: He is oriented to person, place, and time. He appears well-developed. No distress.  Cardiovascular: Normal rate, regular rhythm, normal heart sounds and intact distal pulses.  Pulmonary/Chest: Effort normal and breath sounds normal. No respiratory distress.  Abdominal: Bowel sounds are normal.  Musculoskeletal: Normal range of motion.  Left elbow joint swelling notable  Neurological: He is alert and oriented to person, place, and time.  Skin: There is erythema.  Erythema is around 1 inch laceration and slightly distal and streaking proximally into the upper arm on the medial aspect, there is mild tenderness; laceration itself had drained mild serosanguinous drainage, but was currently dry; bluish white tissue in middle of wound, some yellow granulation also  Psychiatric: He has a normal mood and affect.    Labs reviewed: Basic Metabolic Panel: Recent Labs    10/19/16  NA 140  K 4.4  BUN 22*  CREATININE 1.7*  TSH 2.25   Liver Function Tests: Recent Labs    10/19/16 0700  AST 22  ALT 18  ALKPHOS 94   No results for input(s): LIPASE, AMYLASE in the last 8760 hours. No results for input(s): AMMONIA in the last 8760 hours. CBC: Recent Labs    10/19/16  WBC 4.2  HGB 15.6  HCT 45  PLT 154   Lipid Panel: Recent Labs    10/19/16  CHOL 181  HDL 39  LDLCALC 114  TRIG 140   Assessment/Plan 1. Cellulitis of left elbow -ongoing, did not fully resolve with a 7 day course -restart doxy for 10 more days -hydrate -cbc to eval for degree of  infection  2. Infected laceration - doxycycline (VIBRA-TABS) 100 MG tablet; Take 1 tablet (100 mg total) by mouth 2 (two) times daily.  Dispense: 20 tablet; Refill: 0 -as in #1  3. Fall, initial encounter -seems mostly mechanical but needs to hydrate better to prevent orthostasis and renal failure--recheck bmp in am  4. Acquired hypothyroidism -cont current levothyroxine, tsh was normal  5. DVT, lower extremity, recurrent, left (HCC) -ongoing anticoagulation with xarelto, no complications  6. Essential hypertension -bp at goal with current therapy  Labs/tests ordered:  Cbc with diff and bmp in am  Next appt:  4 weeks  Darothy Courtright L. Jaydalyn Demattia, D.O. [facility] [address], [place] [address]  Cell Phone (Mon-Fri 8am-5pm):  [phone] On Call:  [phone] & follow prompts after 5pm & weekends Office Phone:  [phone] Office Fax:  [phone]

## 2017-05-25 NOTE — Patient Instructions (Addendum)
You still have an infection of your left arm after falling and scraping your left elbow.    Continue to keep the elbow clean and dry.   Apply triple antibiotic ointment to the wound after cleansing, then apply a clean bandaid each day.  Complete the 10 day course of doxycycline antibiotics.  Drink plenty of water to prevent dizziness or falls with positional changes.    F/u in 1 month.    Call me if your elbow is not better when you finish the antibiotics.

## 2017-05-26 LAB — BASIC METABOLIC PANEL
BUN: 25 — AB (ref 4–21)
Creatinine: 1.9 — AB (ref 0.6–1.3)
Glucose: 155
Potassium: 4.9 (ref 3.4–5.3)
Sodium: 138 (ref 137–147)

## 2017-05-26 LAB — CBC AND DIFFERENTIAL
HCT: 44 (ref 41–53)
Hemoglobin: 15.1 (ref 13.5–17.5)
Platelets: 187 (ref 150–399)
WBC: 4.2

## 2017-06-14 ENCOUNTER — Encounter: Payer: Self-pay | Admitting: Nurse Practitioner

## 2017-06-14 ENCOUNTER — Ambulatory Visit (INDEPENDENT_AMBULATORY_CARE_PROVIDER_SITE_OTHER): Payer: Medicare Other | Admitting: Nurse Practitioner

## 2017-06-14 VITALS — BP 126/84 | HR 85 | Temp 97.9°F | Ht 73.0 in | Wt 200.5 lb

## 2017-06-14 DIAGNOSIS — L089 Local infection of the skin and subcutaneous tissue, unspecified: Secondary | ICD-10-CM | POA: Diagnosis not present

## 2017-06-14 DIAGNOSIS — T148XXA Other injury of unspecified body region, initial encounter: Secondary | ICD-10-CM | POA: Diagnosis not present

## 2017-06-14 MED ORDER — CLINDAMYCIN HCL 300 MG PO CAPS: 300.0000 mg | ORAL_CAPSULE | Freq: Four times a day (QID) | ORAL | 0 refills | Status: DC

## 2017-06-14 NOTE — Progress Notes (Signed)
Careteam: Patient Care Team: Gayland Curry, DO as PCP - General (Geriatric Medicine)  Advanced Directive information Does Patient Have a Medical Advance Directive?: Yes, Type of Advance Directive: [place];Living will;Out of facility DNR (pink MOST or yellow form)  Allergies  Allergen Reactions  . Neosporin [Neomycin-Bacitracin Zn-Polymyx] Dermatitis  . Penicillins     Chief Complaint  Patient presents with  . Follow-up    Pt is being seen for a recheck of left elbow wound.      HPI: Patient is a 84 y.o. male seen in the office today for follow up on infected laceration of left elbow.  Pt completed 17 days of doxycyline, reports overall it was doing better but then went and saw his daughter who is a pediatrician who had a friend who is a wound specialist gave him a dressing to put over it, he left it on for 4 days and now looks worse.  Denies pain but reports slightly uncomfortable. No pain with movement.  Redness improved and now has come back- remains pink around elbow and laceration has not closed.  Drainage had improved and now increased, brownish yellow.   no fever or chills.   Review of Systems:  Review of Systems  Constitutional: Negative for chills, fever and malaise/fatigue.  Musculoskeletal: Negative for joint pain.  Skin: Negative for itching and rash.       Laceration to left elbow with drainage.   Neurological: Negative for weakness.    Past Medical History:  Diagnosis Date  . DVT (deep venous thrombosis) ([place])   . Hypercholesterolemia   . Hypertension   . Hypothyroid   . Kidney stone    Past Surgical History:  Procedure Laterality Date  . CATARACT EXTRACTION W/ INTRAOCULAR LENS IMPLANT Bilateral   . COLONOSCOPY     Dr. Alvia Grove, prior polyps, but last due to age  . HEMORROIDECTOMY  1984   Dr. Shawnie Dapper  . Plateau repair of right leg  Right   . TOTAL HIP ARTHROPLASTY     Social History:   reports that he quit smoking about 49  years ago. His smoking use included cigarettes. He quit after 23.00 years of use. He has never used smokeless tobacco. He reports that he drinks about 4.2 oz of alcohol per week. He reports that he does not use drugs.  Family History  Problem Relation Age of Onset  . Aortic aneurysm Father     Medications: Patient's Medications  New Prescriptions   No medications on file  Previous Medications   ATORVASTATIN (LIPITOR) 20 MG TABLET    TAKE 1 TABLET BY MOUTH EACH NIGHT AT BEDTIME   LEVOTHYROXINE (SYNTHROID, LEVOTHROID) 100 MCG TABLET    Take 1 tablet (100 mcg total) by mouth daily before breakfast. Take one tablet daily   LISINOPRIL (PRINIVIL,ZESTRIL) 10 MG TABLET    Take 10 mg by mouth daily.   MULTIPLE VITAMINS-MINERALS (PRESERVISION AREDS PO)    Take by mouth. Take one tablet twice daily for vision   RIVAROXABAN (XARELTO) 20 MG TABS TABLET    Take 20 mg by mouth daily with supper.  Modified Medications   No medications on file  Discontinued Medications   DOXYCYCLINE (VIBRA-TABS) 100 MG TABLET    Take 1 tablet (100 mg total) by mouth 2 (two) times daily.     Physical Exam:  Vitals:   06/14/17 1023  BP: 126/84  Pulse: 85  Temp: 97.9 F (36.6 C)  TempSrc: Oral  SpO2:  97%  Weight: 200 lb 8 oz (90.9 kg)  Height: 6\' 1"  (1.854 m)   Body mass index is 26.45 kg/m.  Physical Exam  Constitutional: He appears well-developed and well-nourished.  HENT:  Head: Normocephalic and atraumatic.  Musculoskeletal: Normal range of motion. He exhibits no edema, tenderness or deformity.       Left elbow: He exhibits swelling (minimal) and laceration (1 cm laceration noted, slightly pink to elbow ). He exhibits normal range of motion, no effusion and no deformity. No tenderness found.    Labs reviewed: Basic Metabolic Panel: Recent Labs    10/19/16  NA 140  K 4.4  BUN 22*  CREATININE 1.7*  TSH 2.25   Liver Function Tests: Recent Labs    10/19/16 0700  AST 22  ALT 18  ALKPHOS 94     No results for input(s): LIPASE, AMYLASE in the last 8760 hours. No results for input(s): AMMONIA in the last 8760 hours. CBC: Recent Labs    10/19/16  WBC 4.2  HGB 15.6  HCT 45  PLT 154   Lipid Panel: Recent Labs    10/19/16  CHOL 181  HDL 39  LDLCALC 114  TRIG 140   TSH: Recent Labs    10/19/16  TSH 2.25   A1C: No results found for: HGBA1C   Assessment/Plan 1. Infected laceration -elbow improved but now with worsening redness and drainage. Slight discomfort without overt pain and minimal swelling to joint. Pt reports 1 cm laceration has been unchanged over the last few weeks.  - clindamycin (CLEOCIN) 300 MG capsule; Take 1 capsule (300 mg total) by mouth every 6 (six) hours.  Dispense: 28 capsule; Refill: 0 - warm soaks TID for 30 mins for the next 7 days  -to take probiotic twice daily while on antibiotics and to eat yogurt daily   Next appt: 1 week follow up with Dr Sharee Holster K. [place], [facility] [phone]

## 2017-06-14 NOTE — Patient Instructions (Addendum)
Warm water soaks 3 times daily for 30 mins for 1 week  To start clindamycin 300 mg by mouth every 6 hours for 1 week.   To take probiotics twice daily To eat yogurt daily    To keep follow up with Dr Mariea Clonts

## 2017-06-22 ENCOUNTER — Encounter: Payer: Self-pay | Admitting: Internal Medicine

## 2017-06-22 ENCOUNTER — Non-Acute Institutional Stay: Payer: Medicare Other | Admitting: Internal Medicine

## 2017-06-22 VITALS — BP 126/60 | HR 76 | Temp 98.6°F | Ht 73.0 in | Wt 199.0 lb

## 2017-06-22 DIAGNOSIS — S51012D Laceration without foreign body of left elbow, subsequent encounter: Secondary | ICD-10-CM

## 2017-06-22 NOTE — Progress Notes (Signed)
Location:  [facility] clinic Provider:  Maudell Stanbrough L. Mariea Clonts, D.O., C.M.D.  Code Status: DNR Goals of Care:  Advanced Directives 06/22/2017  Does Patient Have a Medical Advance Directive? Yes  Type of Paramedic of [place];Living will;Out of facility DNR (pink MOST or yellow form)  Does patient want to make changes to medical advance directive? No - Patient declined  Copy of [place] in Chart? Yes  Pre-existing out of facility DNR order (yellow form or pink MOST form) Yellow form placed in chart (order not valid for inpatient use)     Chief Complaint  Patient presents with  . Follow-up    left elbow infection    HPI: Patient is a 84 y.o. male seen today for f/u with left elbow infection after he injured it.    The redness, warmth, swelling and pain have resolved.  There is still a small hole with minimal drainage on his bandaid.  He's had no fever.    I reminded him about hydration due to his prior abnomal labs.   Past Medical History:  Diagnosis Date  . DVT (deep venous thrombosis) ([place])   . Hypercholesterolemia   . Hypertension   . Hypothyroid   . Kidney stone     Past Surgical History:  Procedure Laterality Date  . CATARACT EXTRACTION W/ INTRAOCULAR LENS IMPLANT Bilateral   . COLONOSCOPY     Dr. Alvia Grove, prior polyps, but last due to age  . HEMORROIDECTOMY  1984   Dr. Shawnie Dapper  . Plateau repair of right leg  Right   . TOTAL HIP ARTHROPLASTY      Allergies  Allergen Reactions  . Neosporin [Neomycin-Bacitracin Zn-Polymyx] Dermatitis  . Penicillins     Outpatient Encounter Medications as of 06/22/2017  Medication Sig  . atorvastatin (LIPITOR) 20 MG tablet TAKE 1 TABLET BY MOUTH EACH NIGHT AT BEDTIME  . levothyroxine (SYNTHROID, LEVOTHROID) 100 MCG tablet Take 1 tablet (100 mcg total) by mouth daily before breakfast. Take one tablet daily  . lisinopril (PRINIVIL,ZESTRIL) 10 MG tablet Take 10 mg by mouth daily.  . Multiple  Vitamins-Minerals (PRESERVISION AREDS PO) Take by mouth. Take one tablet twice daily for vision  . rivaroxaban (XARELTO) 20 MG TABS tablet Take 20 mg by mouth daily with supper.  . [DISCONTINUED] clindamycin (CLEOCIN) 300 MG capsule Take 1 capsule (300 mg total) by mouth every 6 (six) hours.   No facility-administered encounter medications on file as of 06/22/2017.     Review of Systems:  Review of Systems  Constitutional: Negative for chills and fever.  HENT: Positive for hearing loss.   Musculoskeletal: Negative for falls.  Skin: Negative for itching and rash.    Health Maintenance  Topic Date Due  . TETANUS/TDAP  05/11/1944  . INFLUENZA VACCINE  09/08/2017  . PNA vac Low Risk Adult (2 of 2 - PPSV23) 01/19/2018    Physical Exam: Vitals:   06/22/17 1050  BP: 126/60  Pulse: 76  Temp: 98.6 F (37 C)  TempSrc: Oral  SpO2: 96%  Weight: 199 lb (90.3 kg)  Height: 6\' 1"  (1.854 m)   Body mass index is 26.25 kg/m. Physical Exam  Constitutional: He is oriented to person, place, and time. No distress.  Musculoskeletal: He exhibits no tenderness.  Neurological: He is alert and oriented to person, place, and time.  Skin: Skin is warm. No rash noted. No erythema. No pallor.  Left elbow with pinpoint opening that remains in elbow, no erythema, warmth, mild  yellow, nonodorous drainage on bandaid, no purulence, no further drainage with pressure on surrounding tissue    Labs reviewed: Basic Metabolic Panel: Recent Labs    10/19/16 05/26/17 0600  NA 140 138  K 4.4 4.9  BUN 22* 25*  CREATININE 1.7* 1.9*  TSH 2.25  --    Liver Function Tests: Recent Labs    10/19/16 0700  AST 22  ALT 18  ALKPHOS 94   No results for input(s): LIPASE, AMYLASE in the last 8760 hours. No results for input(s): AMMONIA in the last 8760 hours. CBC: Recent Labs    10/19/16 05/26/17 0600  WBC 4.2 4.2  HGB 15.6 15.1  HCT 45 44  PLT 154 187   Lipid Panel: Recent Labs    10/19/16  CHOL 181   HDL 39  LDLCALC 114  TRIG 140   Assessment/Plan 1. Skin tear of left elbow without complication, subsequent encounter -healing slowly and cellulitis resolved -continue to keep clean and cover with bandaid due to drainage--do not apply any neosporin or other topicals at this point due to persistent benign-appearing drainage so that wound will dry up and heal fully  Labs/tests ordered:  No new Next appt:  Visit date not found  Unnamed Hino L. Lynia Landry, D.O. [facility] [address],  [address] Cell Phone (Mon-Fri 8am-5pm):  [phone] On Call:  [phone] & follow prompts after 5pm & weekends Office Phone:  [phone] Office Fax:  [phone]

## 2017-07-14 ENCOUNTER — Other Ambulatory Visit: Payer: Self-pay | Admitting: Internal Medicine

## 2017-08-09 ENCOUNTER — Other Ambulatory Visit: Payer: Self-pay | Admitting: *Deleted

## 2017-08-09 MED ORDER — LISINOPRIL 10 MG PO TABS: 10.0000 mg | ORAL_TABLET | Freq: Every day | ORAL | 0 refills | Status: DC

## 2017-08-16 ENCOUNTER — Non-Acute Institutional Stay: Payer: Medicare Other

## 2017-08-16 VITALS — BP 132/68 | HR 72 | Temp 97.5°F | Ht 73.0 in | Wt 203.0 lb

## 2017-08-16 DIAGNOSIS — Z0000 Encounter for general adult medical examination without abnormal findings: Secondary | ICD-10-CM

## 2017-08-16 MED ORDER — TETANUS-DIPHTH-ACELL PERTUSSIS 5-2.5-18.5 LF-MCG/0.5 IM SUSP: 0.5000 mL | Freq: Once | INTRAMUSCULAR | 0 refills | Status: AC

## 2017-08-16 NOTE — Progress Notes (Signed)
Subjective:   Avyay Coger is a 84 y.o. male who presents for an Initial Medicare Annual Wellness Visit at [facility]   Objective:    Today's Vitals   08/16/17 0837  BP: 132/68  Pulse: 72  Temp: (!) 97.5 F (36.4 C)  TempSrc: Oral  SpO2: 97%  Weight: 203 lb (92.1 kg)  Height: 6\' 1"  (1.854 m)   Body mass index is 26.78 kg/m.  Advanced Directives 08/16/2017 06/22/2017 06/14/2017 05/25/2017 01/19/2017 10/06/2016 12/30/2015  Does Patient Have a Medical Advance Directive? Yes Yes Yes Yes Yes Yes Yes  Type of Paramedic of [place];Living will;Out of facility DNR (pink MOST or yellow form) [place];Living will;Out of facility DNR (pink MOST or yellow form) [place];Living will;Out of facility DNR (pink MOST or yellow form) [place];Living will;Out of facility DNR (pink MOST or yellow form) [place];Out of facility DNR (pink MOST or yellow form) [place];Living will [place];Living will  Does patient want to make changes to medical advance directive? No - Patient declined No - Patient declined - No - Patient declined No - Patient declined - -  Copy of [place] in Chart? Yes Yes Yes Yes Yes No - copy requested No - copy requested  Pre-existing out of facility DNR order (yellow form or pink MOST form) Yellow form placed in chart (order not valid for inpatient use) Yellow form placed in chart (order not valid for inpatient use) - Yellow form placed in chart (order not valid for inpatient use) Yellow form placed in chart (order not valid for inpatient use) - -    Current Medications (verified) Outpatient Encounter Medications as of 08/16/2017  Medication Sig  . atorvastatin (LIPITOR) 20 MG tablet TAKE 1 TABLET BY MOUTH EACH NIGHT AT BEDTIME  . levothyroxine (SYNTHROID, LEVOTHROID) 100 MCG tablet Take 1 tablet (100  mcg total) by mouth daily before breakfast. Take one tablet daily  . lisinopril (PRINIVIL,ZESTRIL) 10 MG tablet Take 1 tablet (10 mg total) by mouth daily.  . Multiple Vitamins-Minerals (PRESERVISION AREDS PO) Take by mouth. Take one tablet twice daily for vision  . rivaroxaban (XARELTO) 20 MG TABS tablet Take 20 mg by mouth daily with supper.  . Tdap (BOOSTRIX) 5-2.5-18.5 LF-MCG/0.5 injection Inject 0.5 mLs into the muscle once for 1 dose.  . [DISCONTINUED] Tdap (BOOSTRIX) 5-2.5-18.5 LF-MCG/0.5 injection Inject 0.5 mLs into the muscle once.   No facility-administered encounter medications on file as of 08/16/2017.     Allergies (verified) Neosporin [neomycin-bacitracin zn-polymyx] and Penicillins   History: Past Medical History:  Diagnosis Date  . DVT (deep venous thrombosis) ([place])   . Hypercholesterolemia   . Hypertension   . Hypothyroid   . Kidney stone    Past Surgical History:  Procedure Laterality Date  . CATARACT EXTRACTION W/ INTRAOCULAR LENS IMPLANT Bilateral   . COLONOSCOPY     Dr. Alvia Grove, prior polyps, but last due to age  . HEMORROIDECTOMY  1984   Dr. Shawnie Dapper  . Plateau repair of right leg  Right   . TOTAL HIP ARTHROPLASTY     Family History  Problem Relation Age of Onset  . Aortic aneurysm Father    Social History   Socioeconomic History  . Marital status: Married    Spouse name: Not on file  . Number of children: Not on file  . Years of education: Not on file  .  Highest education level: Not on file  Occupational History  . Occupation: Chief Executive Officer  Social Needs  . Financial resource strain: Not hard at all  . Food insecurity:    Worry: Never true    Inability: Never true  . Transportation needs:    Medical: No    Non-medical: No  Tobacco Use  . Smoking status: Former Smoker    Years: 23.00    Types: Cigarettes    Last attempt to quit: 02/09/1968    Years since quitting: 49.5  . Smokeless tobacco: Never Used  Substance and Sexual Activity  . Alcohol  use: Yes    Alcohol/week: 4.2 oz    Types: 7 Shots of liquor per week  . Drug use: No  . Sexual activity: Not on file  Lifestyle  . Physical activity:    Days per week: 3 days    Minutes per session: 30 min  . Stress: Only a little  Relationships  . Social connections:    Talks on phone: More than three times a week    Gets together: More than three times a week    Attends religious service: More than 4 times per year    Active member of club or organization: No    Attends meetings of clubs or organizations: Never    Relationship status: Married  Other Topics Concern  . Not on file  Social History Narrative   Married second wife [name], married 24-May-2008. First wife died 05-24-05   Former smoker -stopped in 05-24-68, smoked for 23 years   Alcohol one daily liquor   Caffeine 1-2 cups daily    Exercise golf, and walk   Tobacco Counseling Counseling given: Not Answered   Clinical Intake:  Pre-visit preparation completed: No  Pain : No/denies pain     Nutritional Risks: None Diabetes: No  How often do you need to have someone help you when you read instructions, pamphlets, or other written materials from your doctor or pharmacy?: 1 - Never What is the last grade level you completed in school?: [place] Needed?: No  Information entered by :: Tyson Dense, RN  Activities of Daily Living In your present state of health, do you have any difficulty performing the following activities: 08/16/2017  Hearing? N  Vision? N  Difficulty concentrating or making decisions? N  Walking or climbing stairs? N  Dressing or bathing? N  Doing errands, shopping? N  Preparing Food and eating ? N  Using the Toilet? N  In the past six months, have you accidently leaked urine? N  Do you have problems with loss of bowel control? N  Managing your Medications? N  Managing your Finances? N  Housekeeping or managing your Housekeeping? N  Some recent data might be hidden       Immunizations and Health Maintenance Immunization History  Administered Date(s) Administered  . Influenza, High Dose Seasonal PF 11/28/2014  . Influenza-Unspecified 12/08/2016  . Pneumococcal Conjugate-13 01/19/2017   Health Maintenance Due  Topic Date Due  . Samul Dada  05/11/1944    Patient Care Team: Gayland Curry, DO as PCP - General (Geriatric Medicine)  Indicate any recent Medical Services you may have received from other than [facility] providers in the past year (date may be approximate).    Assessment:   This is a routine wellness examination for Jaise.  Hearing/Vision screen Hearing Screening Comments: Bilateral hearing aids Vision Screening Comments: Sees eye doctor annually  Dietary issues and exercise activities discussed: Current  Exercise Habits: Structured exercise class, Type of exercise: Other - see comments(chair fit class), Time (Minutes): 30, Frequency (Times/Week): 3, Weekly Exercise (Minutes/Week): 90, Intensity: Mild, Exercise limited by: None identified  Goals    None     Depression Screen PHQ 2/9 Scores 08/16/2017 06/22/2017 05/25/2017 01/19/2017  PHQ - 2 Score 0 0 0 0    Fall Risk Fall Risk  08/16/2017 06/22/2017 06/14/2017 05/25/2017 05/17/2017  Falls in the past year? No No Yes Yes Yes  Number falls in past yr: - - 1 1 1   Injury with Fall? - - Yes Yes Yes    Is the patient's home free of loose throw rugs in walkways, pet beds, electrical cords, etc?   yes      Grab bars in the bathroom? yes      Handrails on the stairs?   yes      Adequate lighting?   yes  Cognitive Function: MMSE - Mini Mental State Exam 08/16/2017  Orientation to time 4  Orientation to Place 5  Registration 3  Attention/ Calculation 5  Recall 1  Language- name 2 objects 2  Language- repeat 1  Language- follow 3 step command 3  Language- read & follow direction 1  Write a sentence 1  Copy design 1  Total score 27        Screening Tests Health Maintenance  Topic Date Due   . TETANUS/TDAP  05/11/1944  . INFLUENZA VACCINE  09/08/2017  . PNA vac Low Risk Adult (2 of 2 - PPSV23) 01/19/2018    Qualifies for Shingles Vaccine? Excluded, patient stated he never had chicken pox  Cancer Screenings: Lung: Low Dose CT Chest recommended if Age 75-80 years, 30 pack-year currently smoking OR have quit w/in 15years. Patient does not qualify. Colorectal: excluded due to age  Additional Screenings:  Hepatitis C Screening: declined TDAP due-ordered to pharmacy      Plan:    I have personally reviewed and addressed the Medicare Annual Wellness questionnaire and have noted the following in the patient's chart:  A. Medical and social history B. Use of alcohol, tobacco or illicit drugs  C. Current medications and supplements D. Functional ability and status E.  Nutritional status F.  Physical activity G. Advance directives H. List of other physicians I.  Hospitalizations, surgeries, and ER visits in previous 12 months J.  [place] to include hearing, vision, cognitive, depression L. Referrals and appointments - none  In addition, I have reviewed and discussed with patient certain preventive protocols, quality metrics, and best practice recommendations. A written personalized care plan for preventive services as well as general preventive health recommendations were provided to patient.  See attached scanned questionnaire for additional information.   Signed,   Tyson Dense, RN Nurse Health Advisor  Patient Concerns: None

## 2017-08-16 NOTE — Patient Instructions (Signed)
[name] , Thank you for taking time to come for your Medicare Wellness Visit. I appreciate your ongoing commitment to your health goals. Please review the following plan we discussed and let me know if I can assist you in the future.   Screening recommendations/referrals: Colonoscopy excluded, over age 82 Recommended yearly ophthalmology/optometry visit for glaucoma screening and checkup Recommended yearly dental visit for hygiene and checkup  Vaccinations: Influenza vaccine up to date, due 2019 fall season Pneumococcal vaccine up to date, Pneumovax due 01/19/2018 Tdap vaccine due, ordered to pharmacy Shingles vaccine excluded, never had chicken pox    Advanced directives: In chart  Conditions/risks identified: none  Next appointment: None upcoming scheduled  Preventive Care 65 Years and Older, Male Preventive care refers to lifestyle choices and visits with your health care provider that can promote health and wellness. What does preventive care include?  A yearly physical exam. This is also called an annual well check.  Dental exams once or twice a year.  Routine eye exams. Ask your health care provider how often you should have your eyes checked.  Personal lifestyle choices, including:  Daily care of your teeth and gums.  Regular physical activity.  Eating a healthy diet.  Avoiding tobacco and drug use.  Limiting alcohol use.  Practicing safe sex.  Taking low doses of aspirin every day.  Taking vitamin and mineral supplements as recommended by your health care provider. What happens during an annual well check? The services and screenings done by your health care provider during your annual well check will depend on your age, overall health, lifestyle risk factors, and family history of disease. Counseling  Your health care provider may ask you questions about your:  Alcohol use.  Tobacco use.  Drug use.  Emotional well-being.  Home and relationship  well-being.  Sexual activity.  Eating habits.  History of falls.  Memory and ability to understand (cognition).  Work and work Statistician. Screening  You may have the following tests or measurements:  Height, weight, and BMI.  Blood pressure.  Lipid and cholesterol levels. These may be checked every 5 years, or more frequently if you are over 36 years old.  Skin check.  Lung cancer screening. You may have this screening every year starting at age 56 if you have a 30-pack-year history of smoking and currently smoke or have quit within the past 15 years.  Fecal occult blood test (FOBT) of the stool. You may have this test every year starting at age 52.  Flexible sigmoidoscopy or colonoscopy. You may have a sigmoidoscopy every 5 years or a colonoscopy every 10 years starting at age 48.  Prostate cancer screening. Recommendations will vary depending on your family history and other risks.  Hepatitis C blood test.  Hepatitis B blood test.  Sexually transmitted disease (STD) testing.  Diabetes screening. This is done by checking your blood sugar (glucose) after you have not eaten for a while (fasting). You may have this done every 1-3 years.  Abdominal aortic aneurysm (AAA) screening. You may need this if you are a current or former smoker.  Osteoporosis. You may be screened starting at age 71 if you are at high risk. Talk with your health care provider about your test results, treatment options, and if necessary, the need for more tests. Vaccines  Your health care provider may recommend certain vaccines, such as:  Influenza vaccine. This is recommended every year.  Tetanus, diphtheria, and acellular pertussis (Tdap, Td) vaccine. You may need  a Td booster every 10 years.  Zoster vaccine. You may need this after age 34.  Pneumococcal 13-valent conjugate (PCV13) vaccine. One dose is recommended after age 27.  Pneumococcal polysaccharide (PPSV23) vaccine. One dose is  recommended after age 56. Talk to your health care provider about which screenings and vaccines you need and how often you need them. This information is not intended to replace advice given to you by your health care provider. Make sure you discuss any questions you have with your health care provider. Document Released: 02/21/2015 Document Revised: 10/15/2015 Document Reviewed: 11/26/2014 Elsevier Interactive Patient Education  2017 [place] Prevention in the Home Falls can cause injuries. They can happen to people of all ages. There are many things you can do to make your home safe and to help prevent falls. What can I do on the outside of my home?  Regularly fix the edges of walkways and driveways and fix any cracks.  Remove anything that might make you trip as you walk through a door, such as a raised step or threshold.  Trim any bushes or trees on the path to your home.  Use bright outdoor lighting.  Clear any walking paths of anything that might make someone trip, such as rocks or tools.  Regularly check to see if handrails are loose or broken. Make sure that both sides of any steps have handrails.  Any raised decks and porches should have guardrails on the edges.  Have any leaves, snow, or ice cleared regularly.  Use sand or salt on walking paths during winter.  Clean up any spills in your garage right away. This includes oil or grease spills. What can I do in the bathroom?  Use night lights.  Install grab bars by the toilet and in the tub and shower. Do not use towel bars as grab bars.  Use non-skid mats or decals in the tub or shower.  If you need to sit down in the shower, use a plastic, non-slip stool.  Keep the floor dry. Clean up any water that spills on the floor as soon as it happens.  Remove soap buildup in the tub or shower regularly.  Attach bath mats securely with double-sided non-slip rug tape.  Do not have throw rugs and other things on  the floor that can make you trip. What can I do in the bedroom?  Use night lights.  Make sure that you have a light by your bed that is easy to reach.  Do not use any sheets or blankets that are too big for your bed. They should not hang down onto the floor.  Have a firm chair that has side arms. You can use this for support while you get dressed.  Do not have throw rugs and other things on the floor that can make you trip. What can I do in the kitchen?  Clean up any spills right away.  Avoid walking on wet floors.  Keep items that you use a lot in easy-to-reach places.  If you need to reach something above you, use a strong step stool that has a grab bar.  Keep electrical cords out of the way.  Do not use floor polish or wax that makes floors slippery. If you must use wax, use non-skid floor wax.  Do not have throw rugs and other things on the floor that can make you trip. What can I do with my stairs?  Do not leave any items on the  stairs.  Make sure that there are handrails on both sides of the stairs and use them. Fix handrails that are broken or loose. Make sure that handrails are as long as the stairways.  Check any carpeting to make sure that it is firmly attached to the stairs. Fix any carpet that is loose or worn.  Avoid having throw rugs at the top or bottom of the stairs. If you do have throw rugs, attach them to the floor with carpet tape.  Make sure that you have a light switch at the top of the stairs and the bottom of the stairs. If you do not have them, ask someone to add them for you. What else can I do to help prevent falls?  Wear shoes that:  Do not have high heels.  Have rubber bottoms.  Are comfortable and fit you well.  Are closed at the toe. Do not wear sandals.  If you use a stepladder:  Make sure that it is fully opened. Do not climb a closed stepladder.  Make sure that both sides of the stepladder are locked into place.  Ask someone to  hold it for you, if possible.  Clearly mark and make sure that you can see:  Any grab bars or handrails.  First and last steps.  Where the edge of each step is.  Use tools that help you move around (mobility aids) if they are needed. These include:  Canes.  Walkers.  Scooters.  Crutches.  Turn on the lights when you go into a dark area. Replace any light bulbs as soon as they burn out.  Set up your furniture so you have a clear path. Avoid moving your furniture around.  If any of your floors are uneven, fix them.  If there are any pets around you, be aware of where they are.  Review your medicines with your doctor. Some medicines can make you feel dizzy. This can increase your chance of falling. Ask your doctor what other things that you can do to help prevent falls. This information is not intended to replace advice given to you by your health care provider. Make sure you discuss any questions you have with your health care provider. Document Released: 11/21/2008 Document Revised: 07/03/2015 Document Reviewed: 03/01/2014 Elsevier Interactive Patient Education  2017 [employer].

## 2017-08-24 ENCOUNTER — Other Ambulatory Visit: Payer: Self-pay

## 2017-08-24 MED ORDER — LEVOTHYROXINE SODIUM 100 MCG PO TABS: 100.0000 ug | ORAL_TABLET | Freq: Every day | ORAL | 0 refills | Status: DC

## 2017-10-17 ENCOUNTER — Other Ambulatory Visit: Payer: Self-pay | Admitting: Internal Medicine

## 2017-11-17 ENCOUNTER — Other Ambulatory Visit: Payer: Self-pay | Admitting: Internal Medicine

## 2017-12-12 ENCOUNTER — Ambulatory Visit (INDEPENDENT_AMBULATORY_CARE_PROVIDER_SITE_OTHER): Payer: Medicare Other | Admitting: Internal Medicine

## 2017-12-12 ENCOUNTER — Encounter: Payer: Self-pay | Admitting: Internal Medicine

## 2017-12-12 VITALS — BP 128/80 | HR 66 | Temp 98.0°F | Ht 73.0 in | Wt 204.0 lb

## 2017-12-12 DIAGNOSIS — N529 Male erectile dysfunction, unspecified: Secondary | ICD-10-CM

## 2017-12-12 DIAGNOSIS — S81812A Laceration without foreign body, left lower leg, initial encounter: Secondary | ICD-10-CM

## 2017-12-12 DIAGNOSIS — Z23 Encounter for immunization: Secondary | ICD-10-CM | POA: Diagnosis not present

## 2017-12-12 MED ORDER — SILDENAFIL CITRATE 100 MG PO TABS: 100.0000 mg | ORAL_TABLET | Freq: Every day | ORAL | 3 refills | Status: DC | PRN

## 2017-12-12 NOTE — Addendum Note (Signed)
Addended by: Despina Hidden on: 12/12/2017 02:27 PM   Modules accepted: Orders

## 2017-12-12 NOTE — Progress Notes (Signed)
Location:  [facility] clinic Provider:  Bibiana Gillean L. Mariea Clonts, D.O., C.M.D.  Code Status: DNR Goals of Care:  Advanced Directives 12/12/2017  Does Patient Have a Medical Advance Directive? Yes  Type of Paramedic of [place];Living will;Out of facility DNR (pink MOST or yellow form)  Does patient want to make changes to medical advance directive? No - Patient declined  Copy of [place] in Chart? Yes  Pre-existing out of facility DNR order (yellow form or pink MOST form) Yellow form placed in chart (order not valid for inpatient use)     Chief Complaint  Patient presents with  . Acute Visit    skin tear left leg    HPI: Patient is a 84 y.o. male seen today for an acute visit at the request of the [place] clinic nurse due to concern about left lower leg skin tear being infected.  Of note, he saw her Friday and there was surrounding erythema around the skin tear where he bumped his leg.  He says it was the size of a thumb and the nurse had outlined the borders with a sharpie.  It has receded since on the distal border and is no longer as warm and the swelling of his foot has resolved.  He had a gray foam dressing and fabric tape holding it in place.    Requests viagra that his previous physician had prescribed for him.  Counseled on safe sex practices and risks of infection not being any less with age.  Pt is unsure of his previous dose but will let us know.    He also got his flu shot while here.  Past Medical History:  Diagnosis Date  . DVT (deep venous thrombosis) ([place])   . Hypercholesterolemia   . Hypertension   . Hypothyroid   . Kidney stone     Past Surgical History:  Procedure Laterality Date  . CATARACT EXTRACTION W/ INTRAOCULAR LENS IMPLANT Bilateral   . COLONOSCOPY     Dr. Alvia Grove, prior polyps, but last due to age  . HEMORROIDECTOMY  1984   Dr. Shawnie Dapper  . Plateau repair of right leg  Right   . TOTAL HIP ARTHROPLASTY       Allergies  Allergen Reactions  . Neosporin [Neomycin-Bacitracin Zn-Polymyx] Dermatitis  . Penicillins     Outpatient Encounter Medications as of 12/12/2017  Medication Sig  . atorvastatin (LIPITOR) 20 MG tablet TAKE 1 TABLET BY MOUTH EACH NIGHT AT BEDTIME  . levothyroxine (SYNTHROID, LEVOTHROID) 100 MCG tablet TAKE 1 TABLET (100 MCG TOTAL) BY MOUTH DAILY BEFORE BREAKFAST. TAKE ONE TABLET DAILY  . lisinopril (PRINIVIL,ZESTRIL) 10 MG tablet Take 1 tablet (10 mg total) by mouth daily.  . Multiple Vitamins-Minerals (PRESERVISION AREDS PO) Take by mouth. Take one tablet twice daily for vision  . rivaroxaban (XARELTO) 20 MG TABS tablet Take 20 mg by mouth daily with supper.   No facility-administered encounter medications on file as of 12/12/2017.     Review of Systems:  Review of Systems  Constitutional: Negative for chills, fever and malaise/fatigue.  HENT: Negative for congestion.   Eyes: Negative for blurred vision.  Respiratory: Negative for shortness of breath.   Cardiovascular: Negative for chest pain and palpitations.  Gastrointestinal: Negative for abdominal pain.  Genitourinary:       ED  Musculoskeletal: Negative for falls and joint pain.  Skin: Negative for itching and rash.       Skin tear about 1 cm long vertically; erythema  around with dressing over it  Neurological: Negative for dizziness and loss of consciousness.  Endo/Heme/Allergies: Bruises/bleeds easily.  Psychiatric/Behavioral: Negative for memory loss.    Health Maintenance  Topic Date Due  . TETANUS/TDAP  05/11/1944  . INFLUENZA VACCINE  09/08/2017  . PNA vac Low Risk Adult (2 of 2 - PPSV23) 01/19/2018    Physical Exam: Vitals:   12/12/17 1140  BP: 128/80  Pulse: 66  Temp: 98 F (36.7 C)  TempSrc: Oral  SpO2: 97%  Weight: 204 lb (92.5 kg)  Height: 6\' 1"  (1.854 m)   Body mass index is 26.91 kg/m. Physical Exam  Constitutional: He appears well-developed. No distress.  Cardiovascular:  Normal rate, regular rhythm, normal heart sounds and intact distal pulses.  Pulmonary/Chest: Effort normal and breath sounds normal.  Musculoskeletal: Normal range of motion.  Neurological: He is alert.  Skin:  Left anterior distal shin with 1cm skin tear with eschar now in place; surrounding purplish erythema, very mild warmth; no drainage; no tenderness, no swelling of his foot, erythema has receded on distal aspect a good cm from sharpie line    Labs reviewed: Basic Metabolic Panel: Recent Labs    05/26/17 0600  NA 138  K 4.9  BUN 25*  CREATININE 1.9*   Liver Function Tests: No results for input(s): AST, ALT, ALKPHOS, BILITOT, PROT, ALBUMIN in the last 8760 hours. No results for input(s): LIPASE, AMYLASE in the last 8760 hours. No results for input(s): AMMONIA in the last 8760 hours. CBC: Recent Labs    05/26/17 0600  WBC 4.2  HGB 15.1  HCT 44  PLT 187   Lipid Panel: No results for input(s): CHOL, HDL, LDLCALC, TRIG, CHOLHDL, LDLDIRECT in the last 8760 hours. No results found for: HGBA1C  Procedures since last visit: No results found.  Assessment/Plan 1. Noninfected skin tear of left lower extremity, initial encounter -does not appear infected at this time -cont dressing until appt with clinic nurse -monitor for fever, swelling, increased erythema, warmth or drainage which would suggest infection  2. Erectile dysfunction, unspecified erectile dysfunction type -I will prescribe viagra if he calls [place] back and notifies [place], CMA, of dose he was using before -counseled on safe sex  3. Need for influenza vaccination - Flu vaccine HIGH DOSE PF (Fluzone High dose) given  Labs/tests ordered:  Will need tsh, cbc, cmp, flp before  Next appt:  6 mos at [facility] with fasting labs before   Trinisha Paget L. Humzah Harty, D.O. [facility] [address], [place] [address] Cell Phone (Mon-Fri 8am-5pm):  [phone] On Call:  [phone]  & follow prompts after 5pm & weekends Office Phone:  [phone] Office Fax:  [phone]

## 2018-01-12 ENCOUNTER — Other Ambulatory Visit: Payer: Self-pay | Admitting: *Deleted

## 2018-01-12 MED ORDER — ATORVASTATIN CALCIUM 20 MG PO TABS: ORAL_TABLET | ORAL | 1 refills | Status: DC

## 2018-01-12 NOTE — Telephone Encounter (Signed)
[facility] 

## 2018-05-03 ENCOUNTER — Other Ambulatory Visit: Payer: Self-pay | Admitting: Internal Medicine

## 2018-05-19 ENCOUNTER — Other Ambulatory Visit: Payer: Self-pay | Admitting: Internal Medicine

## 2018-06-04 ENCOUNTER — Encounter: Payer: Self-pay | Admitting: Internal Medicine

## 2018-06-14 ENCOUNTER — Other Ambulatory Visit: Payer: Self-pay

## 2018-06-14 ENCOUNTER — Non-Acute Institutional Stay: Payer: Medicare Other | Admitting: Internal Medicine

## 2018-06-14 ENCOUNTER — Encounter: Payer: Self-pay | Admitting: Internal Medicine

## 2018-06-14 VITALS — BP 130/70 | HR 66 | Temp 97.8°F | Ht 73.0 in | Wt 198.0 lb

## 2018-06-14 DIAGNOSIS — Z23 Encounter for immunization: Secondary | ICD-10-CM | POA: Diagnosis not present

## 2018-06-14 DIAGNOSIS — E039 Hypothyroidism, unspecified: Secondary | ICD-10-CM | POA: Diagnosis not present

## 2018-06-14 DIAGNOSIS — I82402 Acute embolism and thrombosis of unspecified deep veins of left lower extremity: Secondary | ICD-10-CM | POA: Diagnosis not present

## 2018-06-14 DIAGNOSIS — N529 Male erectile dysfunction, unspecified: Secondary | ICD-10-CM

## 2018-06-14 DIAGNOSIS — R2689 Other abnormalities of gait and mobility: Secondary | ICD-10-CM

## 2018-06-14 DIAGNOSIS — E7800 Pure hypercholesterolemia, unspecified: Secondary | ICD-10-CM

## 2018-06-14 DIAGNOSIS — I10 Essential (primary) hypertension: Secondary | ICD-10-CM | POA: Diagnosis not present

## 2018-06-14 NOTE — Progress Notes (Signed)
Location:  [facility] Service:  Clinic (12)  Provider: Atiyana Welte L. Mariea Clonts, D.O., C.M.D.  Code Status: DNR Goals of Care:  Advanced Directives 06/14/2018  Does Patient Have a Medical Advance Directive? Yes  Type of Paramedic of [place];Living will;Out of facility DNR (pink MOST or yellow form)  Does patient want to make changes to medical advance directive? No - Patient declined  Copy of [place] in Chart? Yes - validated most recent copy scanned in chart (See row information)  Pre-existing out of facility DNR order (yellow form or pink MOST form) Yellow form placed in chart (order not valid for inpatient use)     Chief Complaint  Patient presents with  . Medical Management of Chronic Issues    17mth follow-up    HPI: Patient is a 85 y.o. male seen today for medical management of chronic diseases.    Right ear feels stopped up, but CMA noted no cerumen.  Bernadette washed it out a couple of weeks ago.  He had not been hearing as well--does hear better.  Feels like there's water in it.    He reports doing well.    He uses a ski pole to walk outside now.  He's not as steady as he once was.  He's wiling to get some PT.    No pain.    Sleeps well at night.    He takes miralax only when his bowels don't move as he'd like.    He is still getting exercise.  He does the 30 minutes of chair exercises three times a week.  He does a little walking with his ski pole, but no particular mileage.  No bladder control problems.  Skin tear in November took a long time to heal.  His vision at a distance is not as good as it was--has difficulty seeing people's faces from a distance.  He does have a regular ophthalmologist--Dr. Jodi Mourning in [place].  Seen in Dec or Jan and goes again in August.  He had cataract surgery about 12-15 years ago with him.  He had just been going every year, but now going every 6 months.  He  has lost 6 lbs.  He's not aware of why this would have happened.  He typically does eat three meals per day.  They get one meal delivered daily.  He and his wife will share one.  He's not a big eater.    Past Medical History:  Diagnosis Date  . DVT (deep venous thrombosis) ([place])   . Hypercholesterolemia   . Hypertension   . Hypothyroid   . Kidney stone     Past Surgical History:  Procedure Laterality Date  . CATARACT EXTRACTION W/ INTRAOCULAR LENS IMPLANT Bilateral   . COLONOSCOPY     Dr. Alvia Grove, prior polyps, but last due to age  . HEMORROIDECTOMY  1984   Dr. Shawnie Dapper  . Plateau repair of right leg  Right   . TOTAL HIP ARTHROPLASTY      Allergies  Allergen Reactions  . Neosporin [Neomycin-Bacitracin Zn-Polymyx] Dermatitis  . Penicillins     Outpatient Encounter Medications as of 06/14/2018  Medication Sig  . atorvastatin (LIPITOR) 20 MG tablet Take 20 mg by mouth daily.  Marland Kitchen levothyroxine (SYNTHROID, LEVOTHROID) 100 MCG tablet TAKE 1 TABLET (100 MCG TOTAL) BY MOUTH DAILY BEFORE BREAKFAST. TAKE ONE TABLET DAILY  . lisinopril (PRINIVIL,ZESTRIL) 10 MG tablet TAKE 1 TABLET BY MOUTH EVERY DAY  .  Multiple Vitamins-Minerals (PRESERVISION AREDS PO) Take by mouth 2 (two) times a day.   . rivaroxaban (XARELTO) 20 MG TABS tablet Take 20 mg by mouth daily with supper.  . sildenafil (VIAGRA) 100 MG tablet Take 1 tablet (100 mg total) by mouth daily as needed for erectile dysfunction.  . [DISCONTINUED] atorvastatin (LIPITOR) 20 MG tablet Take one tablet by mouth each night at bedtime   No facility-administered encounter medications on file as of 06/14/2018.     Review of Systems:  Review of Systems  Constitutional: Negative for chills, fever and malaise/fatigue.  HENT: Positive for hearing loss.        Right ear fluid   Eyes: Negative for blurred vision.  Respiratory: Negative for cough and shortness of breath.   Cardiovascular: Negative for chest pain and leg swelling.   Gastrointestinal: Positive for constipation. Negative for abdominal pain.  Genitourinary: Negative for dysuria.  Musculoskeletal: Negative for falls and joint pain.       No recent falls, but has had balance problems and near falls  Skin: Negative for itching and rash.  Neurological: Negative for dizziness and loss of consciousness.  Endo/Heme/Allergies: Positive for environmental allergies.  Psychiatric/Behavioral: Positive for memory loss. Negative for depression. The patient is not nervous/anxious and does not have insomnia.     Health Maintenance  Topic Date Due  . TETANUS/TDAP  05/11/1944  . PNA vac Low Risk Adult (2 of 2 - PPSV23) 01/19/2018  . INFLUENZA VACCINE  09/09/2018    Physical Exam: Vitals:   06/14/18 0834  BP: 130/70  Pulse: 66  Temp: 97.8 F (36.6 C)  TempSrc: Oral  SpO2: 98%  Weight: 198 lb (89.8 kg)  Height: 6\' 1"  (1.854 m)   Body mass index is 26.12 kg/m. Physical Exam Constitutional:      Appearance: Normal appearance.     Comments: A bit disheveled  HENT:     Head: Normocephalic and atraumatic.     Right Ear: There is no impacted cerumen.     Left Ear: There is no impacted cerumen.     Ears:     Comments: Right ear with fluid behind TM, but good light reflex and no erythema or tenderness/irritation; hearing aids    Nose: Nose normal.  Cardiovascular:     Rate and Rhythm: Normal rate and regular rhythm.  Pulmonary:     Effort: Pulmonary effort is normal.     Breath sounds: Normal breath sounds.  Musculoskeletal: Normal range of motion.     Comments: Unsteady gait--wobbles side to side a bit   Skin:    General: Skin is warm and dry.     Comments: Some sebaceous cysts and keratoses around neck area  Neurological:     Mental Status: He is alert and oriented to person, place, and time. Mental status is at baseline.  Psychiatric:        Mood and Affect: Mood normal.     Labs reviewed: Basic Metabolic Panel: No results for input(s): NA, K,  CL, CO2, GLUCOSE, BUN, CREATININE, CALCIUM, MG, PHOS, TSH in the last 8760 hours. Liver Function Tests: No results for input(s): AST, ALT, ALKPHOS, BILITOT, PROT, ALBUMIN in the last 8760 hours. No results for input(s): LIPASE, AMYLASE in the last 8760 hours. No results for input(s): AMMONIA in the last 8760 hours. CBC: No results for input(s): WBC, NEUTROABS, HGB, HCT, MCV, PLT in the last 8760 hours. Lipid Panel: No results for input(s): CHOL, HDL, LDLCALC, TRIG, CHOLHDL, LDLDIRECT in the  last 8760 hours. No results found for: HGBA1C  Procedures since last visit: No results found.  Assessment/Plan 1. Need for 23-polyvalent pneumococcal polysaccharide vaccine - Pneumococcal polysaccharide vaccine 23-valent greater than or equal to 2yo subcutaneous/IM  2. DVT, lower extremity, recurrent, left (HCC) -need cbc and renal function, cont xarelto, no bruising or bleeding outside of the norm for him  3. Acquired hypothyroidism -need tsh, cont levothyroxine  4. Erectile dysfunction, unspecified erectile dysfunction type -continues viagra as needed  5. Essential hypertension -bp at goal, cont same regimen and monitor, not dizzy on standing , but balance looks more cerebellar/ataxic  6. Pure hypercholesterolemia -cont statin, needs to get his labs that he forgot to do   7. Balance problem PT for balance with [facility]--referral given here at [place]  Labs/tests ordered:  Cbc w/ diff, cmp w/ gfr, tsh, flp in am (he forgot to get them before the visit) Next appt:  6 mos for CPE  Cartha Rotert L. Keedan Sample, D.O. [facility] [address], [place] [address] Cell Phone (Mon-Fri 8am-5pm):  [phone] On Call:  [phone] & follow prompts after 5pm & weekends Office Phone:  [phone] Office Fax:  [phone]

## 2018-06-15 ENCOUNTER — Encounter: Payer: Self-pay | Admitting: Internal Medicine

## 2018-06-15 LAB — CBC AND DIFFERENTIAL
HCT: 47 (ref 41–53)
Hemoglobin: 16.3 (ref 13.5–17.5)
Platelets: 141 — AB (ref 150–399)
WBC: 5.4

## 2018-06-15 LAB — LIPID PANEL
Cholesterol: 186 (ref 0–200)
HDL: 48 (ref 35–70)
LDL Cholesterol: 111
Triglycerides: 146 (ref 40–160)

## 2018-06-15 LAB — HEPATIC FUNCTION PANEL
ALT: 12 (ref 10–40)
AST: 19 (ref 14–40)
Alkaline Phosphatase: 105 (ref 25–125)
Bilirubin, Total: 0.4

## 2018-06-15 LAB — BASIC METABOLIC PANEL
BUN: 26 — AB (ref 4–21)
Creatinine: 1.9 — AB (ref 0.6–1.3)
Glucose: 93
Potassium: 4.5 (ref 3.4–5.3)
Sodium: 141 (ref 137–147)

## 2018-06-15 LAB — TSH: TSH: 1.54 (ref 0.41–5.90)

## 2018-06-19 ENCOUNTER — Encounter: Payer: Self-pay | Admitting: Internal Medicine

## 2018-07-17 ENCOUNTER — Other Ambulatory Visit: Payer: Self-pay | Admitting: Internal Medicine

## 2018-10-30 ENCOUNTER — Other Ambulatory Visit: Payer: Self-pay | Admitting: Internal Medicine

## 2018-11-27 ENCOUNTER — Other Ambulatory Visit: Payer: Self-pay | Admitting: Internal Medicine

## 2018-12-05 ENCOUNTER — Ambulatory Visit (INDEPENDENT_AMBULATORY_CARE_PROVIDER_SITE_OTHER): Payer: Medicare Other | Admitting: Nurse Practitioner

## 2018-12-05 ENCOUNTER — Other Ambulatory Visit: Payer: Self-pay

## 2018-12-05 DIAGNOSIS — Z0000 Encounter for general adult medical examination without abnormal findings: Secondary | ICD-10-CM

## 2018-12-05 MED ORDER — TETANUS-DIPHTH-ACELL PERTUSSIS 5-2.5-18.5 LF-MCG/0.5 IM SUSP: 0.5000 mL | Freq: Once | INTRAMUSCULAR | 0 refills | Status: AC

## 2018-12-05 NOTE — Patient Instructions (Signed)
[name] , Thank you for taking time to come for your Medicare Wellness Visit. I appreciate your ongoing commitment to your health goals. Please review the following plan we discussed and let me know if I can assist you in the future.   Screening recommendations/referrals: Colonoscopy aged out Recommended yearly ophthalmology/optometry visit for glaucoma screening and checkup Recommended yearly dental visit for hygiene and checkup  Vaccinations: Influenza vaccine - up to date Pneumococcal vaccine up to date Tdap vaccine - need to update, can get at local pharmacy Shingles vaccine - NEED- to get Shringrix at local pharmacy     Advanced directives: on file.   Conditions/risks identified: to increase exercise as able.   Next appointment: 1 year.   Preventive Care 83 Years and Older, Male Preventive care refers to lifestyle choices and visits with your health care provider that can promote health and wellness. What does preventive care include?  A yearly physical exam. This is also called an annual well check.  Dental exams once or twice a year.  Routine eye exams. Ask your health care provider how often you should have your eyes checked.  Personal lifestyle choices, including:  Daily care of your teeth and gums.  Regular physical activity.  Eating a healthy diet.  Avoiding tobacco and drug use.  Limiting alcohol use.  Practicing safe sex.  Taking low doses of aspirin every day.  Taking vitamin and mineral supplements as recommended by your health care provider. What happens during an annual well check? The services and screenings done by your health care provider during your annual well check will depend on your age, overall health, lifestyle risk factors, and family history of disease. Counseling  Your health care provider may ask you questions about your:  Alcohol use.  Tobacco use.  Drug use.  Emotional well-being.  Home and relationship well-being.   Sexual activity.  Eating habits.  History of falls.  Memory and ability to understand (cognition).  Work and work Statistician. Screening  You may have the following tests or measurements:  Height, weight, and BMI.  Blood pressure.  Lipid and cholesterol levels. These may be checked every 5 years, or more frequently if you are over 70 years old.  Skin check.  Lung cancer screening. You may have this screening every year starting at age 19 if you have a 30-pack-year history of smoking and currently smoke or have quit within the past 15 years.  Fecal occult blood test (FOBT) of the stool. You may have this test every year starting at age 75.  Flexible sigmoidoscopy or colonoscopy. You may have a sigmoidoscopy every 5 years or a colonoscopy every 10 years starting at age 36.  Prostate cancer screening. Recommendations will vary depending on your family history and other risks.  Hepatitis C blood test.  Hepatitis B blood test.  Sexually transmitted disease (STD) testing.  Diabetes screening. This is done by checking your blood sugar (glucose) after you have not eaten for a while (fasting). You may have this done every 1-3 years.  Abdominal aortic aneurysm (AAA) screening. You may need this if you are a current or former smoker.  Osteoporosis. You may be screened starting at age 70 if you are at high risk. Talk with your health care provider about your test results, treatment options, and if necessary, the need for more tests. Vaccines  Your health care provider may recommend certain vaccines, such as:  Influenza vaccine. This is recommended every year.  Tetanus, diphtheria, and acellular  pertussis (Tdap, Td) vaccine. You may need a Td booster every 10 years.  Zoster vaccine. You may need this after age 7.  Pneumococcal 13-valent conjugate (PCV13) vaccine. One dose is recommended after age 62.  Pneumococcal polysaccharide (PPSV23) vaccine. One dose is recommended after  age 69. Talk to your health care provider about which screenings and vaccines you need and how often you need them. This information is not intended to replace advice given to you by your health care provider. Make sure you discuss any questions you have with your health care provider. Document Released: 02/21/2015 Document Revised: 10/15/2015 Document Reviewed: 11/26/2014 Elsevier Interactive Patient Education  2017 [place] Prevention in the Home Falls can cause injuries. They can happen to people of all ages. There are many things you can do to make your home safe and to help prevent falls. What can I do on the outside of my home?  Regularly fix the edges of walkways and driveways and fix any cracks.  Remove anything that might make you trip as you walk through a door, such as a raised step or threshold.  Trim any bushes or trees on the path to your home.  Use bright outdoor lighting.  Clear any walking paths of anything that might make someone trip, such as rocks or tools.  Regularly check to see if handrails are loose or broken. Make sure that both sides of any steps have handrails.  Any raised decks and porches should have guardrails on the edges.  Have any leaves, snow, or ice cleared regularly.  Use sand or salt on walking paths during winter.  Clean up any spills in your garage right away. This includes oil or grease spills. What can I do in the bathroom?  Use night lights.  Install grab bars by the toilet and in the tub and shower. Do not use towel bars as grab bars.  Use non-skid mats or decals in the tub or shower.  If you need to sit down in the shower, use a plastic, non-slip stool.  Keep the floor dry. Clean up any water that spills on the floor as soon as it happens.  Remove soap buildup in the tub or shower regularly.  Attach bath mats securely with double-sided non-slip rug tape.  Do not have throw rugs and other things on the floor that can  make you trip. What can I do in the bedroom?  Use night lights.  Make sure that you have a light by your bed that is easy to reach.  Do not use any sheets or blankets that are too big for your bed. They should not hang down onto the floor.  Have a firm chair that has side arms. You can use this for support while you get dressed.  Do not have throw rugs and other things on the floor that can make you trip. What can I do in the kitchen?  Clean up any spills right away.  Avoid walking on wet floors.  Keep items that you use a lot in easy-to-reach places.  If you need to reach something above you, use a strong step stool that has a grab bar.  Keep electrical cords out of the way.  Do not use floor polish or wax that makes floors slippery. If you must use wax, use non-skid floor wax.  Do not have throw rugs and other things on the floor that can make you trip. What can I do with my stairs?  Do not leave any items on the stairs.  Make sure that there are handrails on both sides of the stairs and use them. Fix handrails that are broken or loose. Make sure that handrails are as long as the stairways.  Check any carpeting to make sure that it is firmly attached to the stairs. Fix any carpet that is loose or worn.  Avoid having throw rugs at the top or bottom of the stairs. If you do have throw rugs, attach them to the floor with carpet tape.  Make sure that you have a light switch at the top of the stairs and the bottom of the stairs. If you do not have them, ask someone to add them for you. What else can I do to help prevent falls?  Wear shoes that:  Do not have high heels.  Have rubber bottoms.  Are comfortable and fit you well.  Are closed at the toe. Do not wear sandals.  If you use a stepladder:  Make sure that it is fully opened. Do not climb a closed stepladder.  Make sure that both sides of the stepladder are locked into place.  Ask someone to hold it for you,  if possible.  Clearly mark and make sure that you can see:  Any grab bars or handrails.  First and last steps.  Where the edge of each step is.  Use tools that help you move around (mobility aids) if they are needed. These include:  Canes.  Walkers.  Scooters.  Crutches.  Turn on the lights when you go into a dark area. Replace any light bulbs as soon as they burn out.  Set up your furniture so you have a clear path. Avoid moving your furniture around.  If any of your floors are uneven, fix them.  If there are any pets around you, be aware of where they are.  Review your medicines with your doctor. Some medicines can make you feel dizzy. This can increase your chance of falling. Ask your doctor what other things that you can do to help prevent falls. This information is not intended to replace advice given to you by your health care provider. Make sure you discuss any questions you have with your health care provider. Document Released: 11/21/2008 Document Revised: 07/03/2015 Document Reviewed: 03/01/2014 Elsevier Interactive Patient Education  2017 [employer].

## 2018-12-05 NOTE — Progress Notes (Signed)
Subjective:   Stanley Brooks is a 85 y.o. male who presents for Medicare Annual/Subsequent preventive examination.  Review of Systems:         Objective:    Vitals: There were no vitals taken for this visit.  There is no height or weight on file to calculate BMI.  Advanced Directives 12/05/2018 06/14/2018 12/12/2017 08/16/2017 06/22/2017 06/14/2017 05/25/2017  Does Patient Have a Medical Advance Directive? Yes Yes Yes Yes Yes Yes Yes  Type of Paramedic of [place];Living will [place];Living will;Out of facility DNR (pink MOST or yellow form) [place];Living will;Out of facility DNR (pink MOST or yellow form) [place];Living will;Out of facility DNR (pink MOST or yellow form) [place];Living will;Out of facility DNR (pink MOST or yellow form) [place];Living will;Out of facility DNR (pink MOST or yellow form) [place];Living will;Out of facility DNR (pink MOST or yellow form)  Does patient want to make changes to medical advance directive? No - Patient declined No - Patient declined No - Patient declined No - Patient declined No - Patient declined - No - Patient declined  Copy of [place] in Chart? Yes - validated most recent copy scanned in chart (See row information) Yes - validated most recent copy scanned in chart (See row information) Yes Yes Yes Yes Yes  Pre-existing out of facility DNR order (yellow form or pink MOST form) - Yellow form placed in chart (order not valid for inpatient use) Yellow form placed in chart (order not valid for inpatient use) Yellow form placed in chart (order not valid for inpatient use) Yellow form placed in chart (order not valid for inpatient use) - Yellow form placed in chart (order not valid for inpatient use)    Tobacco Social History   Tobacco Use  Smoking Status Former Smoker  . Years: 23.00  .  Types: Cigarettes  . Quit date: 02/09/1968  . Years since quitting: 50.8  Smokeless Tobacco Never Used     Counseling given: Not Answered   Clinical Intake:                       Past Medical History:  Diagnosis Date  . DVT (deep venous thrombosis) ([place])   . Hypercholesterolemia   . Hypertension   . Hypothyroid   . Kidney stone    Past Surgical History:  Procedure Laterality Date  . CATARACT EXTRACTION W/ INTRAOCULAR LENS IMPLANT Bilateral   . COLONOSCOPY     Dr. Alvia Brooks, prior polyps, but last due to age  . HEMORROIDECTOMY  1984   Dr. Shawnie Brooks  . Plateau repair of right leg  Right   . TOTAL HIP ARTHROPLASTY     Family History  Problem Relation Age of Onset  . Aortic aneurysm Father    Social History   Socioeconomic History  . Marital status: Married    Spouse name: Not on file  . Number of children: Not on file  . Years of education: Not on file  . Highest education level: Not on file  Occupational History  . Occupation: Chief Executive Officer  Social Needs  . Financial resource strain: Not hard at all  . Food insecurity    Worry: Never true    Inability: Never true  . Transportation needs    Medical: No    Non-medical: No  Tobacco Use  . Smoking status: Former Smoker  Years: 23.00    Types: Cigarettes    Quit date: 02/09/1968    Years since quitting: 50.8  . Smokeless tobacco: Never Used  Substance and Sexual Activity  . Alcohol use: Yes    Alcohol/week: 7.0 standard drinks    Types: 7 Shots of liquor per week  . Drug use: No  . Sexual activity: Not on file  Lifestyle  . Physical activity    Days per week: 3 days    Minutes per session: 30 min  . Stress: Only a little  Relationships  . Social connections    Talks on phone: More than three times a week    Gets together: More than three times a week    Attends religious service: More than 4 times per year    Active member of club or organization: No    Attends meetings of clubs or organizations:  Never    Relationship status: Married  Other Topics Concern  . Not on file  Social History Narrative   Married second wife Stanley Brooks, married May 28, 2008. First wife died 05/28/2005   Former smoker -stopped in 05-28-1968, smoked for 23 years   Alcohol one daily liquor   Caffeine 1-2 cups daily    Exercise golf, and walk    Outpatient Encounter Medications as of 12/05/2018  Medication Sig  . atorvastatin (LIPITOR) 20 MG tablet TAKE ONE TABLET BY MOUTH EACH NIGHT AT BEDTIME  . levothyroxine (SYNTHROID) 100 MCG tablet TAKE 1 TABLET (100 MCG TOTAL) BY MOUTH DAILY BEFORE BREAKFAST  . lisinopril (ZESTRIL) 10 MG tablet TAKE 1 TABLET BY MOUTH EVERY DAY  . Multiple Vitamins-Minerals (PRESERVISION AREDS PO) Take by mouth 2 (two) times a day.   . rivaroxaban (XARELTO) 20 MG TABS tablet Take 20 mg by mouth daily with supper.  . sildenafil (VIAGRA) 100 MG tablet Take 1 tablet (100 mg total) by mouth daily as needed for erectile dysfunction.  . Tdap (BOOSTRIX) 5-2.5-18.5 LF-MCG/0.5 injection Inject 0.5 mLs into the muscle once for 1 dose.  . [DISCONTINUED] Tdap (BOOSTRIX) 5-2.5-18.5 LF-MCG/0.5 injection Inject 0.5 mLs into the muscle once.   No facility-administered encounter medications on file as of 12/05/2018.     Activities of Daily Living No flowsheet data found.  Patient Care Team: Gayland Curry, DO as PCP - General (Geriatric Medicine)   Assessment:   This is a routine wellness examination for Stanley Brooks.  Exercise Activities and Dietary recommendations    Goals   None     Fall Risk Fall Risk  12/05/2018 06/14/2018 12/12/2017 08/16/2017 06/22/2017  Falls in the past year? 0 0 0 No No  Number falls in past yr: - 0 - - -  Injury with Fall? - 0 - - -   Is the patient's home free of loose throw rugs in walkways, pet beds, electrical cords, etc?   yes      Grab bars in the bathroom? yes      Handrails on the stairs?   yes      Adequate lighting?   yes  Timed Get Up and Go Performed: na  Depression Screen  PHQ 2/9 Scores 12/05/2018 06/14/2018 12/12/2017 08/16/2017  PHQ - 2 Score 0 0 0 0    Cognitive Function MMSE - Mini Mental State Exam 08/16/2017  Orientation to time 4  Orientation to Place 5  Registration 3  Attention/ Calculation 5  Recall 1  Language- name 2 objects 2  Language- repeat 1  Language- follow 3 step command 3  Language- read & follow direction 1  Write a sentence 1  Copy design 1  Total score 27     6CIT Screen 12/05/2018  What Year? 0 points  What month? 0 points  What time? 0 points  Count back from 20 0 points  Months in reverse 0 points  Repeat phrase 0 points  Total Score 0    Immunization History  Administered Date(s) Administered  . Influenza, High Dose Seasonal PF 11/28/2014, 12/12/2017, 10/08/2018  . Influenza-Unspecified 12/08/2016  . Pneumococcal Conjugate-13 01/19/2017  . Pneumococcal Polysaccharide-23 06/14/2018    Qualifies for Shingles Vaccine? Yes.   Screening Tests Health Maintenance  Topic Date Due  . TETANUS/TDAP  05/11/1944  . INFLUENZA VACCINE  Completed  . PNA vac Low Risk Adult  Completed   Cancer Screenings: Lung: Low Dose CT Chest recommended if Age 32-80 years, 30 pack-year currently smoking OR have quit w/in 15years. Patient does not qualify. Colorectal: aged out  Additional Screenings:  Hepatitis C Screening:na      Plan:     I have personally reviewed and noted the following in the patient's chart:   . Medical and social history . Use of alcohol, tobacco or illicit drugs  . Current medications and supplements . Functional ability and status . Nutritional status . Physical activity . Advanced directives . List of other physicians . Hospitalizations, surgeries, and ER visits in previous 12 months . Vitals . Screenings to include cognitive, depression, and falls . Referrals and appointments  In addition, I have reviewed and discussed with patient certain preventive protocols, quality metrics, and best  practice recommendations. A written personalized care plan for preventive services as well as general preventive health recommendations were provided to patient.     Lauree Chandler, NP  12/05/2018

## 2018-12-05 NOTE — Progress Notes (Signed)
This service is provided via telemedicine  No vital signs collected/recorded due to the encounter was a telemedicine visit.   Location of patient (ex: home, work):  Home  Patient consents to a telephone visit:  Yes  Location of the provider (ex: office, home):  [facility]  Name of any referring provider:  Hollace Kinnier, DO  Names of all persons participating in the telemedicine service and their role in the encounter:  Bonney Leitz, [place]; Sherrie Mustache, NP; patient   Time spent on call: 10.18  CMA time only

## 2018-12-20 ENCOUNTER — Other Ambulatory Visit: Payer: Self-pay

## 2018-12-20 ENCOUNTER — Encounter: Payer: Medicare Other | Admitting: Internal Medicine

## 2018-12-20 DIAGNOSIS — H6521 Chronic serous otitis media, right ear: Secondary | ICD-10-CM | POA: Insufficient documentation

## 2018-12-20 DIAGNOSIS — H903 Sensorineural hearing loss, bilateral: Secondary | ICD-10-CM | POA: Insufficient documentation

## 2018-12-20 DIAGNOSIS — H6123 Impacted cerumen, bilateral: Secondary | ICD-10-CM | POA: Insufficient documentation

## 2019-01-03 ENCOUNTER — Encounter: Payer: Self-pay | Admitting: Internal Medicine

## 2019-01-03 ENCOUNTER — Other Ambulatory Visit: Payer: Self-pay

## 2019-01-03 ENCOUNTER — Non-Acute Institutional Stay: Payer: Medicare Other | Admitting: Internal Medicine

## 2019-01-03 VITALS — BP 142/74 | HR 80 | Temp 97.0°F | Ht 73.0 in | Wt 200.4 lb

## 2019-01-03 DIAGNOSIS — I10 Essential (primary) hypertension: Secondary | ICD-10-CM

## 2019-01-03 DIAGNOSIS — Z0000 Encounter for general adult medical examination without abnormal findings: Secondary | ICD-10-CM

## 2019-01-03 DIAGNOSIS — K5901 Slow transit constipation: Secondary | ICD-10-CM | POA: Diagnosis not present

## 2019-01-03 DIAGNOSIS — N529 Male erectile dysfunction, unspecified: Secondary | ICD-10-CM

## 2019-01-03 DIAGNOSIS — E039 Hypothyroidism, unspecified: Secondary | ICD-10-CM | POA: Diagnosis not present

## 2019-01-03 DIAGNOSIS — E7800 Pure hypercholesterolemia, unspecified: Secondary | ICD-10-CM

## 2019-01-03 DIAGNOSIS — I82402 Acute embolism and thrombosis of unspecified deep veins of left lower extremity: Secondary | ICD-10-CM | POA: Diagnosis not present

## 2019-01-03 NOTE — Progress Notes (Signed)
Provider:  Rexene Edison. Mariea Clonts, D.O., C.M.D. Location:  Occupational psychologist of Service:  Clinic (12)  Previous PCP: Gayland Curry, DO Patient Care Team: Gayland Curry, DO as PCP - General (Geriatric Medicine)  Extended Emergency Contact Information Primary Emergency Contact: Doristine Bosworth Mobile Phone: [phone] Relation: Spouse  Code Status: DNR Goals of Care: Advanced Directive information Advanced Directives 12/05/2018  Does Patient Have a Medical Advance Directive? Yes  Type of Paramedic of [place];Living will  Does patient want to make changes to medical advance directive? No - Patient declined  Copy of [place] in Chart? Yes - validated most recent copy scanned in chart (See row information)  Pre-existing out of facility DNR order (yellow form or pink MOST form) -   Chief Complaint  Patient presents with  . Annual Exam    CPE    HPI: Patient is a 85 y.o. male seen today for an annual physical exam.  He's doing ok.    He reports his eyes are getting worse.  He goes to ophtho next month.  Has been going annually.    He has just been to ENT--had fluid in his right ear.  He goes again next month after hearing tests next week.    He scored 30/30 on his MMSE.  He failed his clock drawing.    He is going to [place] for the day to his wife's family.    BP today is slightly high, but priors in record are normal.  Suggested he go occasionally to get it checked with clinic nurse.    Past Medical History:  Diagnosis Date  . DVT (deep venous thrombosis) ([place])   . Hypercholesterolemia   . Hypertension   . Hypothyroid   . Kidney stone    Past Surgical History:  Procedure Laterality Date  . CATARACT EXTRACTION W/ INTRAOCULAR LENS IMPLANT Bilateral   . COLONOSCOPY     Dr. Alvia Grove, prior polyps, but last due to age  . HEMORROIDECTOMY  1984   Dr. Shawnie Dapper  . Plateau repair of right leg  Right   . TOTAL HIP  ARTHROPLASTY      reports that he quit smoking about 50 years ago. His smoking use included cigarettes. He quit after 23.00 years of use. He has never used smokeless tobacco. He reports current alcohol use of about 7.0 standard drinks of alcohol per week. He reports that he does not use drugs.  Functional Status Survey:    Family History  Problem Relation Age of Onset  . Aortic aneurysm Father     Health Maintenance  Topic Date Due  . TETANUS/TDAP  05/11/1944  . INFLUENZA VACCINE  Completed  . PNA vac Low Risk Adult  Completed    Allergies  Allergen Reactions  . Neosporin [Neomycin-Bacitracin Zn-Polymyx] Dermatitis  . Penicillins     Outpatient Encounter Medications as of 01/03/2019  Medication Sig  . atorvastatin (LIPITOR) 20 MG tablet TAKE ONE TABLET BY MOUTH EACH NIGHT AT BEDTIME  . levothyroxine (SYNTHROID) 100 MCG tablet TAKE 1 TABLET (100 MCG TOTAL) BY MOUTH DAILY BEFORE BREAKFAST  . lisinopril (ZESTRIL) 10 MG tablet TAKE 1 TABLET BY MOUTH EVERY DAY  . Multiple Vitamins-Minerals (PRESERVISION AREDS PO) Take by mouth 2 (two) times a day.   . rivaroxaban (XARELTO) 20 MG TABS tablet Take 20 mg by mouth daily with supper.  . sildenafil (VIAGRA) 100 MG tablet Take 1 tablet (100 mg total) by mouth daily  as needed for erectile dysfunction.   No facility-administered encounter medications on file as of 01/03/2019.     Review of Systems  Constitutional: Negative for chills, fever and malaise/fatigue.  HENT: Positive for hearing loss. Negative for congestion and sore throat.        Fluid behind right ear  Eyes:       Vision declining but has ophtho visit  Respiratory: Negative for cough and shortness of breath.        A little dyspnea on exertion vs the past--he's 89 now  Cardiovascular: Negative for chest pain, palpitations and leg swelling.  Gastrointestinal: Positive for constipation. Negative for abdominal pain, blood in stool, diarrhea and melena.  Genitourinary:  Negative for dysuria and urgency.  Musculoskeletal: Negative for falls, joint pain and myalgias.       Right knee cracks and pops some  Skin: Negative for itching and rash.  Neurological: Negative for dizziness and loss of consciousness.  Endo/Heme/Allergies: Bruises/bleeds easily.  Psychiatric/Behavioral: Positive for memory loss. Negative for depression. The patient is not nervous/anxious and does not have insomnia.        Mild cognitive impairment    Vitals:   01/03/19 1404  BP: (!) 142/74  Pulse: 80  Temp: (!) 97 F (36.1 C)  TempSrc: Oral  SpO2: 97%  Weight: 200 lb 6.4 oz (90.9 kg)  Height: 6\' 1"  (1.854 m)   Body mass index is 26.44 kg/m. Physical Exam Vitals signs reviewed.  Constitutional:      General: He is not in acute distress.    Appearance: Normal appearance. He is not ill-appearing or toxic-appearing.  HENT:     Head: Normocephalic and atraumatic.     Right Ear: External ear normal.     Left Ear: External ear normal.     Ears:     Comments: Increased cerumen on right and small amt on left    Nose:     Comments: Nose and mouth deferred due to covid masking Eyes:     Extraocular Movements: Extraocular movements intact.     Conjunctiva/sclera: Conjunctivae normal.     Pupils: Pupils are equal, round, and reactive to light.  Neck:     Musculoskeletal: Neck supple. No muscular tenderness.     Vascular: No carotid bruit.  Cardiovascular:     Rate and Rhythm: Normal rate and regular rhythm.     Pulses: Normal pulses.     Heart sounds: Normal heart sounds.  Pulmonary:     Effort: Pulmonary effort is normal.     Breath sounds: Normal breath sounds. No wheezing, rhonchi or rales.  Abdominal:     General: Bowel sounds are normal. There is no distension.     Palpations: Abdomen is soft. There is no mass.     Tenderness: There is no abdominal tenderness. There is no guarding or rebound.  Musculoskeletal: Normal range of motion.        General: No swelling or  tenderness.     Right lower leg: No edema.     Left lower leg: No edema.  Lymphadenopathy:     Cervical: No cervical adenopathy.  Skin:    General: Skin is warm and dry.     Comments: Sees derm  Neurological:     General: No focal deficit present.     Mental Status: He is alert and oriented to person, place, and time.     Cranial Nerves: No cranial nerve deficit.     Sensory: No sensory deficit.  Motor: No weakness.     Coordination: Coordination abnormal.     Gait: Gait normal.     Deep Tendon Reflexes: Reflexes normal.     Comments: Difficulty with heel-to-shin, able to do finger-to-nose ok--actually better left than right though righthanded  Psychiatric:        Mood and Affect: Mood normal.        Behavior: Behavior normal.     Labs reviewed: Basic Metabolic Panel: Recent Labs    06/15/18 0800  NA 141  K 4.5  BUN 26*  CREATININE 1.9*   Liver Function Tests: Recent Labs    06/15/18 0800  AST 19  ALT 12  ALKPHOS 105   No results for input(s): LIPASE, AMYLASE in the last 8760 hours. No results for input(s): AMMONIA in the last 8760 hours. CBC: Recent Labs    06/15/18 0800  WBC 5.4  HGB 16.3  HCT 47  PLT 141*   Cardiac Enzymes: No results for input(s): CKTOTAL, CKMB, CKMBINDEX, TROPONINI in the last 8760 hours. BNP: Invalid input(s): POCBNP No results found for: HGBA1C Lab Results  Component Value Date   TSH 1.54 06/15/2018   Assessment/Plan 1. Annual physical exam -performed today - MMSE good except fails clock (will be scanned)  2. Acquired hypothyroidism -tsh was wnl in may, no concerns to suggest abnormal now, recheck in may  3. Slow transit constipation -continues to use otc miralax 3-4 times per week for bms  4. DVT, lower extremity, recurrent, left ([place]) -continue xarelto, f/u cbc before next visit  5. Essential hypertension -bp at goal with current therapy  6. Pure hypercholesterolemia -f/u flp before next visit, cont  lipitor--last LDL was just over 100  7. Erectile dysfunction, unspecified erectile dysfunction type -continue viagra therapy  Labs/tests ordered:  Cbc with diff, cmp, flp, tsh  F/u in 6 mos for med mgt with fasting labs before  Reannon Candella L. Nova Schmuhl, D.O. [facility] [address], [place] [address] Cell Phone (Mon-Fri 8am-5pm):  [phone] On Call:  [phone] & follow prompts after 5pm & weekends Office Phone:  [phone] Office Fax:  [phone]

## 2019-01-20 ENCOUNTER — Other Ambulatory Visit: Payer: Self-pay | Admitting: Internal Medicine

## 2019-02-21 ENCOUNTER — Other Ambulatory Visit: Payer: Self-pay | Admitting: Internal Medicine

## 2019-03-12 DIAGNOSIS — H353211 Exudative age-related macular degeneration, right eye, with active choroidal neovascularization: Secondary | ICD-10-CM | POA: Diagnosis not present

## 2019-03-15 ENCOUNTER — Encounter: Payer: Self-pay | Admitting: Internal Medicine

## 2019-03-15 DIAGNOSIS — H353122 Nonexudative age-related macular degeneration, left eye, intermediate dry stage: Secondary | ICD-10-CM | POA: Diagnosis not present

## 2019-03-15 DIAGNOSIS — H43813 Vitreous degeneration, bilateral: Secondary | ICD-10-CM

## 2019-03-15 DIAGNOSIS — H353211 Exudative age-related macular degeneration, right eye, with active choroidal neovascularization: Secondary | ICD-10-CM | POA: Diagnosis not present

## 2019-03-15 DIAGNOSIS — H43391 Other vitreous opacities, right eye: Secondary | ICD-10-CM | POA: Diagnosis not present

## 2019-03-15 HISTORY — DX: Vitreous degeneration, bilateral: H43.813

## 2019-03-27 ENCOUNTER — Encounter: Payer: Self-pay | Admitting: Internal Medicine

## 2019-04-12 DIAGNOSIS — H353211 Exudative age-related macular degeneration, right eye, with active choroidal neovascularization: Secondary | ICD-10-CM | POA: Diagnosis not present

## 2019-04-24 ENCOUNTER — Other Ambulatory Visit: Payer: Self-pay | Admitting: Internal Medicine

## 2019-04-24 NOTE — Telephone Encounter (Signed)
rx sent to pharmacy by e-script  

## 2019-05-01 DIAGNOSIS — H938X3 Other specified disorders of ear, bilateral: Secondary | ICD-10-CM | POA: Diagnosis not present

## 2019-05-01 DIAGNOSIS — Z974 Presence of external hearing-aid: Secondary | ICD-10-CM | POA: Diagnosis not present

## 2019-05-01 DIAGNOSIS — H6521 Chronic serous otitis media, right ear: Secondary | ICD-10-CM | POA: Diagnosis not present

## 2019-05-17 ENCOUNTER — Other Ambulatory Visit: Payer: Self-pay | Admitting: Internal Medicine

## 2019-05-17 DIAGNOSIS — Z85828 Personal history of other malignant neoplasm of skin: Secondary | ICD-10-CM | POA: Diagnosis not present

## 2019-05-17 DIAGNOSIS — Z85820 Personal history of malignant melanoma of skin: Secondary | ICD-10-CM | POA: Diagnosis not present

## 2019-05-17 DIAGNOSIS — Z08 Encounter for follow-up examination after completed treatment for malignant neoplasm: Secondary | ICD-10-CM | POA: Diagnosis not present

## 2019-05-17 DIAGNOSIS — L821 Other seborrheic keratosis: Secondary | ICD-10-CM | POA: Diagnosis not present

## 2019-05-17 DIAGNOSIS — L578 Other skin changes due to chronic exposure to nonionizing radiation: Secondary | ICD-10-CM | POA: Diagnosis not present

## 2019-05-17 DIAGNOSIS — D1801 Hemangioma of skin and subcutaneous tissue: Secondary | ICD-10-CM | POA: Diagnosis not present

## 2019-05-17 NOTE — Telephone Encounter (Signed)
rx sent to pharmacy by e-script  

## 2019-05-24 DIAGNOSIS — H43813 Vitreous degeneration, bilateral: Secondary | ICD-10-CM | POA: Diagnosis not present

## 2019-05-24 DIAGNOSIS — H353211 Exudative age-related macular degeneration, right eye, with active choroidal neovascularization: Secondary | ICD-10-CM | POA: Diagnosis not present

## 2019-05-24 DIAGNOSIS — H43391 Other vitreous opacities, right eye: Secondary | ICD-10-CM | POA: Diagnosis not present

## 2019-05-24 DIAGNOSIS — H353122 Nonexudative age-related macular degeneration, left eye, intermediate dry stage: Secondary | ICD-10-CM | POA: Diagnosis not present

## 2019-05-28 DIAGNOSIS — Z9622 Myringotomy tube(s) status: Secondary | ICD-10-CM | POA: Insufficient documentation

## 2019-05-28 DIAGNOSIS — H903 Sensorineural hearing loss, bilateral: Secondary | ICD-10-CM | POA: Diagnosis not present

## 2019-05-28 DIAGNOSIS — H9211 Otorrhea, right ear: Secondary | ICD-10-CM | POA: Insufficient documentation

## 2019-05-28 DIAGNOSIS — H6122 Impacted cerumen, left ear: Secondary | ICD-10-CM | POA: Diagnosis not present

## 2019-05-29 ENCOUNTER — Other Ambulatory Visit: Payer: Self-pay | Admitting: Internal Medicine

## 2019-06-12 DIAGNOSIS — Z9622 Myringotomy tube(s) status: Secondary | ICD-10-CM | POA: Diagnosis not present

## 2019-06-12 DIAGNOSIS — H903 Sensorineural hearing loss, bilateral: Secondary | ICD-10-CM | POA: Diagnosis not present

## 2019-06-12 DIAGNOSIS — H9211 Otorrhea, right ear: Secondary | ICD-10-CM | POA: Diagnosis not present

## 2019-06-28 DIAGNOSIS — D649 Anemia, unspecified: Secondary | ICD-10-CM | POA: Diagnosis not present

## 2019-06-28 DIAGNOSIS — E7800 Pure hypercholesterolemia, unspecified: Secondary | ICD-10-CM | POA: Diagnosis not present

## 2019-06-28 DIAGNOSIS — I10 Essential (primary) hypertension: Secondary | ICD-10-CM | POA: Diagnosis not present

## 2019-06-28 DIAGNOSIS — E039 Hypothyroidism, unspecified: Secondary | ICD-10-CM | POA: Diagnosis not present

## 2019-06-28 DIAGNOSIS — E785 Hyperlipidemia, unspecified: Secondary | ICD-10-CM | POA: Diagnosis not present

## 2019-06-28 LAB — LIPID PANEL
Cholesterol: 183 (ref 0–200)
HDL: 43 (ref 35–70)
LDL Cholesterol: 113
Triglycerides: 134 (ref 40–160)

## 2019-06-28 LAB — BASIC METABOLIC PANEL
BUN: 28 — AB (ref 4–21)
CO2: 18 (ref 13–22)
Chloride: 107 (ref 99–108)
Creatinine: 2.2 — AB (ref 0.6–1.3)
Glucose: 112
Potassium: 4.5 (ref 3.4–5.3)
Sodium: 139 (ref 137–147)

## 2019-06-28 LAB — TSH: TSH: 1.57 (ref 0.41–5.90)

## 2019-06-28 LAB — COMPREHENSIVE METABOLIC PANEL
Albumin: 4.2 (ref 3.5–5.0)
Calcium: 9.2 (ref 8.7–10.7)
Globulin: 2.1

## 2019-06-28 LAB — CBC AND DIFFERENTIAL
HCT: 15 — AB (ref 41–53)
Hemoglobin: 15 (ref 13.5–17.5)
Platelets: 140 — AB (ref 150–399)
WBC: 4.1

## 2019-06-28 LAB — CBC: RBC: 4.58 (ref 3.87–5.11)

## 2019-07-04 ENCOUNTER — Non-Acute Institutional Stay: Payer: Medicare PPO | Admitting: Internal Medicine

## 2019-07-04 ENCOUNTER — Encounter: Payer: Self-pay | Admitting: Internal Medicine

## 2019-07-04 ENCOUNTER — Other Ambulatory Visit: Payer: Self-pay

## 2019-07-04 VITALS — BP 136/74 | HR 70 | Temp 97.7°F | Ht 73.0 in | Wt 201.4 lb

## 2019-07-04 DIAGNOSIS — H9113 Presbycusis, bilateral: Secondary | ICD-10-CM | POA: Diagnosis not present

## 2019-07-04 DIAGNOSIS — R2689 Other abnormalities of gait and mobility: Secondary | ICD-10-CM | POA: Diagnosis not present

## 2019-07-04 DIAGNOSIS — I10 Essential (primary) hypertension: Secondary | ICD-10-CM | POA: Diagnosis not present

## 2019-07-04 DIAGNOSIS — I82402 Acute embolism and thrombosis of unspecified deep veins of left lower extremity: Secondary | ICD-10-CM | POA: Diagnosis not present

## 2019-07-04 DIAGNOSIS — E7800 Pure hypercholesterolemia, unspecified: Secondary | ICD-10-CM

## 2019-07-04 DIAGNOSIS — M7581 Other shoulder lesions, right shoulder: Secondary | ICD-10-CM | POA: Diagnosis not present

## 2019-07-04 DIAGNOSIS — E039 Hypothyroidism, unspecified: Secondary | ICD-10-CM | POA: Diagnosis not present

## 2019-07-04 NOTE — Addendum Note (Signed)
Addended by: Tanna Savoy on: 07/04/2019 03:11 PM   Modules accepted: Orders

## 2019-07-04 NOTE — Patient Instructions (Signed)
Please continue doing your right shoulder exercises. Resume your balance exercises at least 3 days per week that [place] gave you. Be sure to hydrate well with water to keep your kidneys working well

## 2019-07-04 NOTE — Progress Notes (Signed)
Location:  Occupational psychologist of Service:  Clinic (12)  Provider: Mystie Ormand L. Mariea Clonts, D.O., C.M.D.  Code Status: DNR Goals of Care:  Advanced Directives 07/04/2019  Does Patient Have a Medical Advance Directive? Yes  Type of Advance Directive [place]  Does patient want to make changes to medical advance directive? Yes (ED - Information included in AVS)  Copy of [place] in Chart? Yes - validated most recent copy scanned in chart (See row information)  Pre-existing out of facility DNR order (yellow form or pink MOST form) -     Chief Complaint  Patient presents with  . Medical Management of Chronic Issues    6 month follow up     HPI: Patient is a 86 y.o. male seen today for medical management of chronic diseases.    He had therapy a year and a half ago.  He has not been doing the exercises that were provided.  His children gave him two ski poles--he uses one when he's out walking himself.  No falls.    6-8 yrs ago,he fell on the right shoulder.  He'd been to hospital and then saw [facility] ortho  The other day, he thinks he slept on it funny.  Did some exercises that irritated it.  Bothers him when he lifts his arm.    He has macular degeneration.  He's had three injections in his right eye since (Jan, feb, April, and next is in 3-4 mos).  Dr. Iona Hansen on [facility] across from the hospital.  There's been improvement and a little ridge visible at the start has gotten smaller.  He had a tube put in his right ear with Dr. Redmond Baseman.  It still drains some.  His wife saw some spots on the pillowcase this morning.  Had hearing check with audiology Nira Conn Pardue) who noted improvement.    He does bruise easily with his xarelto.  No major bleeding.   Mild thrombocytopenia on labs.    Reminded him about hydration in warm weather especially with his CKD.  Weight is stable.  Slight elevation of bad cholesterol at 111.  Is on  lipitor 20mg .  He's willing to cut down to dessert every other day.    Past Medical History:  Diagnosis Date  . Cataract 2008   left and right eyde By Dr. Jodi Mourning   . DVT (deep venous thrombosis) ([place])   . Exudative age-related macular degeneration of right eye with active choroidal neovascularization ([place])   . Hypercholesterolemia   . Hypertension   . Hypothyroid   . Kidney stone   . Vitreous detachment, bilateral 03/15/2019    Past Surgical History:  Procedure Laterality Date  . CATARACT EXTRACTION W/ INTRAOCULAR LENS IMPLANT Bilateral   . COLONOSCOPY     Dr. Alvia Grove, prior polyps, but last due to age  . HEMORROIDECTOMY  1984   Dr. Shawnie Dapper  . Plateau repair of right leg  Right   . TOTAL HIP ARTHROPLASTY      Allergies  Allergen Reactions  . Neosporin [Neomycin-Bacitracin Zn-Polymyx] Dermatitis  . Penicillins     Outpatient Encounter Medications as of 07/04/2019  Medication Sig  . atorvastatin (LIPITOR) 20 MG tablet TAKE ONE TABLET BY MOUTH EACH NIGHT AT BEDTIME  . levothyroxine (SYNTHROID) 100 MCG tablet TAKE 1 TABLET (100 MCG TOTAL) BY MOUTH DAILY BEFORE BREAKFAST  . lisinopril (ZESTRIL) 10 MG tablet TAKE 1 TABLET BY MOUTH EVERY DAY  . Multiple Vitamins-Minerals (  PRESERVISION AREDS PO) Take by mouth 2 (two) times a day.   . sildenafil (VIAGRA) 100 MG tablet Take 1 tablet (100 mg total) by mouth daily as needed for erectile dysfunction.  Alveda Reasons 20 MG TABS tablet TAKE 1 TABLET BY MOUTH EVERY DAY WITH EVENING MEAL   No facility-administered encounter medications on file as of 07/04/2019.    Review of Systems:  Review of Systems  Constitutional: Negative for chills, fever and malaise/fatigue.  HENT: Positive for hearing loss.        Some drainage still from tube in right ear  Eyes: Positive for blurred vision.  Respiratory: Negative for cough and shortness of breath.   Cardiovascular: Negative for chest pain, palpitations and leg swelling.  Gastrointestinal: Negative  for abdominal pain, blood in stool, constipation, diarrhea and melena.  Genitourinary: Negative for dysuria.  Musculoskeletal: Positive for joint pain. Negative for back pain, falls, myalgias and neck pain.       Knees pop and crack  Skin: Negative for itching and rash.  Neurological: Negative for dizziness, tingling, sensory change and loss of consciousness.       Unsteady gait--somewhat wide-based but was focused as I was watching him walk  Endo/Heme/Allergies: Bruises/bleeds easily.  Psychiatric/Behavioral: Positive for memory loss. Negative for depression. The patient is not nervous/anxious and does not have insomnia.     Health Maintenance  Topic Date Due  . TETANUS/TDAP  Never done  . INFLUENZA VACCINE  09/09/2019  . COVID-19 Vaccine  Completed  . PNA vac Low Risk Adult  Completed    Physical Exam: Vitals:   07/04/19 0926  BP: 136/74  Pulse: 70  Temp: 97.7 F (36.5 C)  TempSrc: Temporal  SpO2: 94%  Weight: 201 lb 6.4 oz (91.4 kg)  Height: 6\' 1"  (1.854 m)   Body mass index is 26.57 kg/m. Physical Exam Vitals reviewed.  Constitutional:      General: He is not in acute distress.    Appearance: Normal appearance. He is not ill-appearing or toxic-appearing.     Comments: A little bit disheveled  HENT:     Head: Normocephalic and atraumatic.     Ears:     Comments: Hearing aids Eyes:     Extraocular Movements: Extraocular movements intact.     Pupils: Pupils are equal, round, and reactive to light.  Cardiovascular:     Rate and Rhythm: Normal rate and regular rhythm.     Pulses: Normal pulses.     Heart sounds: Normal heart sounds.  Pulmonary:     Effort: Pulmonary effort is normal.     Breath sounds: Normal breath sounds. No wheezing, rhonchi or rales.  Abdominal:     General: Bowel sounds are normal.     Palpations: Abdomen is soft.  Musculoskeletal:        General: Normal range of motion.     Right lower leg: No edema.     Left lower leg: No edema.      Comments: Tenderness over right lateral shoulder with ROM ok except for internal rotation  Skin:    General: Skin is warm and dry.  Neurological:     General: No focal deficit present.     Mental Status: He is alert and oriented to person, place, and time.     Cranial Nerves: No cranial nerve deficit.     Sensory: No sensory deficit.     Motor: No weakness.     Coordination: Coordination normal.     Gait:  Gait abnormal.     Comments: Wide-based unsteady gait  Psychiatric:        Mood and Affect: Mood normal.        Behavior: Behavior normal.     Labs reviewed: Basic Metabolic Panel: No results for input(s): NA, K, CL, CO2, GLUCOSE, BUN, CREATININE, CALCIUM, MG, PHOS, TSH in the last 8760 hours. Liver Function Tests: No results for input(s): AST, ALT, ALKPHOS, BILITOT, PROT, ALBUMIN in the last 8760 hours. No results for input(s): LIPASE, AMYLASE in the last 8760 hours. No results for input(s): AMMONIA in the last 8760 hours. CBC: No results for input(s): WBC, NEUTROABS, HGB, HCT, MCV, PLT in the last 8760 hours. Lipid Panel: No results for input(s): CHOL, HDL, LDLCALC, TRIG, CHOLHDL, LDLDIRECT in the last 8760 hours. No results found for: HGBA1C  Assessment/Plan 1. Right rotator cuff tendonitis -cont to do exercises previously given by therapy -may use tylenol and topicals  2. Balance problem -resume prior exercises Thamas Jaegers gave him for this  3. DVT, lower extremity, recurrent, left ([facility]) -cont chronic xarelto, f/u cbc, bmp  4. Acquired hypothyroidism -cont levothyroxine therapy, f/u tsh  5. Pure hypercholesterolemia -cont zetia, reduce desserts, f/u flp  6. Essential hypertension -bp at goal with current regimen, cont same and monitor  Labs/tests ordered:  Cbc, cmp, flp, tsh next draw Next appt:  6 mos CPE  Tresia Revolorio L. Dorota Heinrichs, D.O. [facility] [address], [place] [address] Cell Phone (Mon-Fri 8am-5pm):   [phone] On Call:  [phone] & follow prompts after 5pm & weekends Office Phone:  [phone] Office Fax:  [phone]

## 2019-07-05 DIAGNOSIS — E785 Hyperlipidemia, unspecified: Secondary | ICD-10-CM | POA: Diagnosis not present

## 2019-07-05 DIAGNOSIS — E039 Hypothyroidism, unspecified: Secondary | ICD-10-CM | POA: Diagnosis not present

## 2019-07-05 DIAGNOSIS — E7800 Pure hypercholesterolemia, unspecified: Secondary | ICD-10-CM | POA: Diagnosis not present

## 2019-07-05 DIAGNOSIS — I10 Essential (primary) hypertension: Secondary | ICD-10-CM | POA: Diagnosis not present

## 2019-07-05 DIAGNOSIS — D649 Anemia, unspecified: Secondary | ICD-10-CM | POA: Diagnosis not present

## 2019-07-13 ENCOUNTER — Encounter: Payer: Self-pay | Admitting: Internal Medicine

## 2019-07-13 ENCOUNTER — Telehealth: Payer: Self-pay

## 2019-07-13 NOTE — Telephone Encounter (Signed)
Per Dr. Mariea Clonts .Marland Kitchen... Cholesterol is mildly elevated encourage exercise and avoiding high cholesterol foods. Blood counts are ok. Thyroid is ok. Kidney function decreased. encourage hydration. Avoid Nsaids- aleve, motrin, naproxen, ibuprofen, Advil, goody and Excedrin. Tylenol is ok plus topicals. Result given to patient

## 2019-07-17 ENCOUNTER — Other Ambulatory Visit: Payer: Self-pay | Admitting: Internal Medicine

## 2019-07-19 DIAGNOSIS — H353211 Exudative age-related macular degeneration, right eye, with active choroidal neovascularization: Secondary | ICD-10-CM | POA: Diagnosis not present

## 2019-07-19 DIAGNOSIS — H43391 Other vitreous opacities, right eye: Secondary | ICD-10-CM | POA: Diagnosis not present

## 2019-07-19 DIAGNOSIS — H353122 Nonexudative age-related macular degeneration, left eye, intermediate dry stage: Secondary | ICD-10-CM | POA: Diagnosis not present

## 2019-07-19 DIAGNOSIS — H43813 Vitreous degeneration, bilateral: Secondary | ICD-10-CM | POA: Diagnosis not present

## 2019-08-15 ENCOUNTER — Telehealth: Payer: Self-pay | Admitting: Internal Medicine

## 2019-08-15 DIAGNOSIS — M12811 Other specific arthropathies, not elsewhere classified, right shoulder: Secondary | ICD-10-CM

## 2019-08-15 DIAGNOSIS — M75101 Unspecified rotator cuff tear or rupture of right shoulder, not specified as traumatic: Secondary | ICD-10-CM

## 2019-08-15 NOTE — Telephone Encounter (Signed)
Pt came to clinic to request referral to orthopedics due to right shoulder decreasing ROM and increased pain recently.  Had fall with injury many years ago.  Appears ROM is dramatically worse now with positive drop arm.  Referral to [facility], Dr. Alvan Dame, who he's seen before.

## 2019-08-20 DIAGNOSIS — M7541 Impingement syndrome of right shoulder: Secondary | ICD-10-CM | POA: Diagnosis not present

## 2019-09-03 DIAGNOSIS — H353211 Exudative age-related macular degeneration, right eye, with active choroidal neovascularization: Secondary | ICD-10-CM | POA: Diagnosis not present

## 2019-09-03 DIAGNOSIS — H43813 Vitreous degeneration, bilateral: Secondary | ICD-10-CM | POA: Diagnosis not present

## 2019-09-03 DIAGNOSIS — H353122 Nonexudative age-related macular degeneration, left eye, intermediate dry stage: Secondary | ICD-10-CM | POA: Diagnosis not present

## 2019-09-03 DIAGNOSIS — H43391 Other vitreous opacities, right eye: Secondary | ICD-10-CM | POA: Diagnosis not present

## 2019-10-18 ENCOUNTER — Other Ambulatory Visit: Payer: Self-pay | Admitting: Internal Medicine

## 2019-10-22 DIAGNOSIS — H353122 Nonexudative age-related macular degeneration, left eye, intermediate dry stage: Secondary | ICD-10-CM | POA: Diagnosis not present

## 2019-10-22 DIAGNOSIS — H353211 Exudative age-related macular degeneration, right eye, with active choroidal neovascularization: Secondary | ICD-10-CM | POA: Diagnosis not present

## 2019-10-31 DIAGNOSIS — H6123 Impacted cerumen, bilateral: Secondary | ICD-10-CM | POA: Diagnosis not present

## 2019-11-12 ENCOUNTER — Other Ambulatory Visit: Payer: Self-pay | Admitting: Internal Medicine

## 2019-11-12 NOTE — Telephone Encounter (Signed)
rx sent to pharmacy by e-script  

## 2019-11-19 DIAGNOSIS — L578 Other skin changes due to chronic exposure to nonionizing radiation: Secondary | ICD-10-CM | POA: Diagnosis not present

## 2019-11-19 DIAGNOSIS — L820 Inflamed seborrheic keratosis: Secondary | ICD-10-CM | POA: Diagnosis not present

## 2019-11-19 DIAGNOSIS — D1801 Hemangioma of skin and subcutaneous tissue: Secondary | ICD-10-CM | POA: Diagnosis not present

## 2019-11-19 DIAGNOSIS — L821 Other seborrheic keratosis: Secondary | ICD-10-CM | POA: Diagnosis not present

## 2019-11-19 DIAGNOSIS — Z08 Encounter for follow-up examination after completed treatment for malignant neoplasm: Secondary | ICD-10-CM | POA: Diagnosis not present

## 2019-11-19 DIAGNOSIS — Z85820 Personal history of malignant melanoma of skin: Secondary | ICD-10-CM | POA: Diagnosis not present

## 2019-11-19 DIAGNOSIS — Z85828 Personal history of other malignant neoplasm of skin: Secondary | ICD-10-CM | POA: Diagnosis not present

## 2019-11-19 DIAGNOSIS — L570 Actinic keratosis: Secondary | ICD-10-CM | POA: Diagnosis not present

## 2019-11-21 ENCOUNTER — Other Ambulatory Visit: Payer: Self-pay | Admitting: Internal Medicine

## 2019-12-05 ENCOUNTER — Other Ambulatory Visit: Payer: Self-pay | Admitting: Internal Medicine

## 2019-12-07 ENCOUNTER — Other Ambulatory Visit: Payer: Self-pay

## 2019-12-07 ENCOUNTER — Encounter: Payer: Self-pay | Admitting: Family

## 2019-12-07 ENCOUNTER — Ambulatory Visit (INDEPENDENT_AMBULATORY_CARE_PROVIDER_SITE_OTHER): Payer: Medicare PPO | Admitting: Family

## 2019-12-07 DIAGNOSIS — Z0000 Encounter for general adult medical examination without abnormal findings: Secondary | ICD-10-CM | POA: Diagnosis not present

## 2019-12-07 NOTE — Progress Notes (Addendum)
Subjective:   Stanley Brooks is a 86 y.o. male who presents for Medicare Annual/Subsequent preventive examination.  Review of Systems    Cardiac Risk Factors include: advanced age (>35men, >33 women);male gender;hypertension;dyslipidemia;smoking/ tobacco exposure     Objective:    There were no vitals filed for this visit. There is no height or weight on file to calculate BMI.  Advanced Directives 12/07/2019 07/04/2019 12/05/2018 06/14/2018 12/12/2017 08/16/2017 06/22/2017  Does Patient Have a Medical Advance Directive? Yes Yes Yes Yes Yes Yes Yes  Type of Advance Directive Living will;Healthcare Power of [place];Living will [place];Living will;Out of facility DNR (pink MOST or yellow form) [place];Living will;Out of facility DNR (pink MOST or yellow form) [place];Living will;Out of facility DNR (pink MOST or yellow form) [place];Living will;Out of facility DNR (pink MOST or yellow form)  Does patient want to make changes to medical advance directive? No - Patient declined Yes (ED - Information included in AVS) No - Patient declined No - Patient declined No - Patient declined No - Patient declined No - Patient declined  Copy of [place] in Chart? Yes - validated most recent copy scanned in chart (See row information) Yes - validated most recent copy scanned in chart (See row information) Yes - validated most recent copy scanned in chart (See row information) Yes - validated most recent copy scanned in chart (See row information) Yes Yes Yes  Pre-existing out of facility DNR order (yellow form or pink MOST form) - - - Yellow form placed in chart (order not valid for inpatient use) Yellow form placed in chart (order not valid for inpatient use) Yellow form placed in chart (order not valid for inpatient use) Yellow form placed in chart (order not  valid for inpatient use)    Current Medications (verified) Outpatient Encounter Medications as of 12/07/2019  Medication Sig  . atorvastatin (LIPITOR) 20 MG tablet TAKE ONE TABLET BY MOUTH EACH NIGHT AT BEDTIME  . levothyroxine (SYNTHROID) 100 MCG tablet TAKE 1 TABLET BY MOUTH EVERY DAY BEFORE BREAKFAST  . lisinopril (ZESTRIL) 10 MG tablet TAKE 1 TABLET BY MOUTH EVERY DAY  . Multiple Vitamins-Minerals (PRESERVISION AREDS PO) Take by mouth 2 (two) times a day.   . sildenafil (VIAGRA) 100 MG tablet Take 1 tablet (100 mg total) by mouth daily as needed for erectile dysfunction.  Alveda Reasons 20 MG TABS tablet TAKE 1 TABLET BY MOUTH EVERY DAY WITH EVENING MEAL   No facility-administered encounter medications on file as of 12/07/2019.    Allergies (verified) Neosporin [neomycin-bacitracin zn-polymyx] and Penicillins   History: Past Medical History:  Diagnosis Date  . Cataract 2008   left and right eyde By Dr. Jodi Mourning   . DVT (deep venous thrombosis) ([place])   . Exudative age-related macular degeneration of right eye with active choroidal neovascularization ([place])   . Hypercholesterolemia   . Hypertension   . Hypothyroid   . Kidney stone   . Vitreous detachment, bilateral 03/15/2019   Past Surgical History:  Procedure Laterality Date  . CATARACT EXTRACTION W/ INTRAOCULAR LENS IMPLANT Bilateral   . COLONOSCOPY     Dr. Alvia Grove, prior polyps, but last due to age  . HEMORROIDECTOMY  1984   Dr. Shawnie Dapper  . Plateau repair of right leg  Right   . TOTAL HIP ARTHROPLASTY     Family History  Problem Relation Age of Onset  .  Aortic aneurysm Father    Social History   Socioeconomic History  . Marital status: Married    Spouse name: Not on file  . Number of children: Not on file  . Years of education: Not on file  . Highest education level: Not on file  Occupational History  . Occupation: Chief Executive Officer  Tobacco Use  . Smoking status: Former Smoker    Years: 23.00    Types: Cigarettes    Quit  date: 02/09/1968    Years since quitting: 51.8  . Smokeless tobacco: Never Used  Vaping Use  . Vaping Use: Never used  Substance and Sexual Activity  . Alcohol use: Yes    Alcohol/week: 7.0 standard drinks    Types: 7 Shots of liquor per week  . Drug use: No  . Sexual activity: Not on file  Other Topics Concern  . Not on file  Social History Narrative   Married second wife Stanley Brooks, married 2008/05/11. First wife died 11-May-2005   Former smoker -stopped in May 11, 1968, smoked for 23 years   Alcohol one daily liquor   Caffeine 1-2 cups daily    Exercise golf, and walk   Social Determinants of Health   Financial Resource Strain:   . Difficulty of Paying Living Expenses: Not on file  Food Insecurity:   . Worried About Charity fundraiser in the Last Year: Not on file  . Ran Out of Food in the Last Year: Not on file  Transportation Needs:   . Lack of Transportation (Medical): Not on file  . Lack of Transportation (Non-Medical): Not on file  Physical Activity:   . Days of Exercise per Week: Not on file  . Minutes of Exercise per Session: Not on file  Stress:   . Feeling of Stress : Not on file  Social Connections:   . Frequency of Communication with Friends and Family: Not on file  . Frequency of Social Gatherings with Friends and Family: Not on file  . Attends Religious Services: Not on file  . Active Member of Clubs or Organizations: Not on file  . Attends Archivist Meetings: Not on file  . Marital Status: Not on file    Tobacco Counseling Counseling given: Not Answered   Clinical Intake:  Pre-visit preparation completed: No  Pain : No/denies pain     BMI - recorded: 26.58 Nutritional Status: BMI 25 -29 Overweight Nutritional Risks: None Diabetes: No  How often do you need to have someone help you when you read instructions, pamphlets, or other written materials from your doctor or pharmacy?: 1 - Never What is the last grade level you completed in school?: law  schooll  Diabetic? No   Interpreter Needed?: No  Information entered by :: Hiromi Knodel FNP-C   Activities of Daily Living In your present state of health, do you have any difficulty performing the following activities: 12/07/2019  Hearing? Y  Comment wears hearing aids  Vision? N  Difficulty concentrating or making decisions? Y  Comment remembering  Walking or climbing stairs? Y  Comment uses a cane  Dressing or bathing? N  Doing errands, shopping? N  Preparing Food and eating ? Y  Comment eats at the facility [facility]  Using the Toilet? N  Comment takes miralax once in a while  In the past six months, have you accidently leaked urine? N  Do you have problems with loss of bowel control? N  Managing your Medications? N  Managing your Finances? N  Housekeeping  or managing your Housekeeping? N  Some recent data might be hidden    Patient Care Team: Gayland Curry, DO as PCP - General (Geriatric Medicine) Desma Maxim, MD as Referring Physician (Ophthalmology) Melida Quitter, MD as Consulting Physician (Otolaryngology)  Indicate any recent Medical Services you may have received from other than [facility] providers in the past year (date may be approximate).     Assessment:   This is a routine wellness examination for Jaun.  Hearing/Vision screen  Hearing Screening   125Hz  250Hz  500Hz  1000Hz  2000Hz  3000Hz  4000Hz  6000Hz  8000Hz   Right ear:           Left ear:           Comments: No Hearing Concerns. Patient wears Hearing AID.   Vision Screening Comments: Some vision concerns. Last Eye Exam 2021. Patient doesn't wear prescription glasses but receives shots in eye.  Dietary issues and exercise activities discussed: Current Exercise Habits: Home exercise routine, Type of exercise: treadmill;walking, Time (Minutes): 30, Frequency (Times/Week): 3, Weekly Exercise (Minutes/Week): 90, Intensity: Mild, Exercise limited by: None identified  Goals    . Exercise 3x per week (30  min per time)    . Patient Stated     To maintain current level of health      Depression Screen PHQ 2/9 Scores 12/07/2019 07/04/2019 01/03/2019 12/05/2018 06/14/2018 12/12/2017 08/16/2017  PHQ - 2 Score 0 0 0 0 0 0 0  Exception Documentation - Patient refusal - - - - -    Fall Risk Fall Risk  12/07/2019 07/04/2019 01/03/2019 12/05/2018 06/14/2018  Falls in the past year? 0 0 0 0 0  Number falls in past yr: 0 0 0 - 0  Injury with Fall? 0 0 0 - 0    Any stairs in or around the home? No  If so, are there any without handrails? No  Home free of loose throw rugs in walkways, pet beds, electrical cords, etc? No  Adequate lighting in your home to reduce risk of falls? No   ASSISTIVE DEVICES UTILIZED TO PREVENT FALLS:  Life alert? No  Use of a cane, walker or w/c? Yes  Grab bars in the bathroom? Yes  Shower chair or bench in shower? Yes  Elevated toilet seat or a handicapped toilet? Yes   TIMED UP AND GO:  Was the test performed? No .  Length of time to ambulate 10 feet: N/A sec.   Gait steady and fast with assistive device  Cognitive Function: MMSE - Mini Mental State Exam 01/03/2019 08/16/2017  Not completed: (No Data) -  Orientation to time 5 4  Orientation to Place 5 5  Registration 3 3  Attention/ Calculation 5 5  Recall 3 1  Language- name 2 objects 2 2  Language- repeat 1 1  Language- follow 3 step command 3 3  Language- read & follow direction 1 1  Write a sentence 1 1  Copy design 1 1  Total score 30 27     6CIT Screen 12/07/2019 12/05/2018  What Year? 0 points 0 points  What month? 0 points 0 points  What time? 0 points 0 points  Count back from 20 0 points 0 points  Months in reverse 0 points 0 points  Repeat phrase 0 points 0 points  Total Score 0 0    Immunizations Immunization History  Administered Date(s) Administered  . Influenza, High Dose Seasonal PF 11/28/2014, 12/12/2017, 10/08/2018  . Influenza-Unspecified 12/08/2016  . [place] SARS-COVID-2  Vaccination  02/20/2019, 03/20/2019  . Pneumococcal Conjugate-13 01/19/2017  . Pneumococcal Polysaccharide-23 06/14/2018    TDAP status: Due, Education has been provided regarding the importance of this vaccine. Advised may receive this vaccine at local pharmacy or Health Dept. Aware to provide a copy of the vaccination record if obtained from local pharmacy or Health Dept. Verbalized acceptance and understanding. Flu Vaccine status: Up to date: due on upcoming visit  Pneumococcal vaccine status: Up to date Covid-19 vaccine status: Completed vaccines  Qualifies for Shingles Vaccine? Yes   Zostavax completed Yes   Shingrix Completed?: No.    Education has been provided regarding the importance of this vaccine. Patient has been advised to call insurance company to determine out of pocket expense if they have not yet received this vaccine. Advised may also receive vaccine at local pharmacy or Health Dept. Verbalized acceptance and understanding.  Screening Tests Health Maintenance  Topic Date Due  . TETANUS/TDAP  Never done  . INFLUENZA VACCINE  09/09/2019  . COVID-19 Vaccine  Completed  . PNA vac Low Risk Adult  Completed    Health Maintenance  Health Maintenance Due  Topic Date Due  . TETANUS/TDAP  Never done  . INFLUENZA VACCINE  09/09/2019    Colorectal cancer screening: No longer required.   Lung Cancer Screening: (Low Dose CT Chest recommended if Age 17-80 years, 30 pack-year currently smoking OR have quit w/in 15years.) does not qualify.   Lung Cancer Screening Referral: No   Additional Screening:  Hepatitis C Screening: does not qualify; Completed N/A  Vision Screening: Recommended annual ophthalmology exams for early detection of glaucoma and other disorders of the eye. Is the patient up to date with their annual eye exam?  Yes  Who is the provider or what is the name of the office in which the patient attends annual eye exams? [facility]  If pt is  not established with a provider, would they like to be referred to a provider to establish care? No .   Dental Screening: Recommended annual dental exams for proper oral hygiene  Community Resource Referral / Chronic Care Management: CRR required this visit?  No   CCM required this visit?  No     Plan:    - Influenza and Tdap vaccine   I have personally reviewed and noted the following in the patient's chart:   . Medical and social history . Use of alcohol, tobacco or illicit drugs  . Current medications and supplements . Functional ability and status . Nutritional status . Physical activity . Advanced directives . List of other physicians . Hospitalizations, surgeries, and ER visits in previous 12 months . Vitals . Screenings to include cognitive, depression, and falls . Referrals and appointments  In addition, I have reviewed and discussed with patient certain preventive protocols, quality metrics, and best practice recommendations. A written personalized care plan for preventive services as well as general preventive health recommendations were provided to patient.     Sandrea Hughs, NP   12/07/2019   Nurse Notes:No

## 2019-12-07 NOTE — Patient Instructions (Signed)
[name] , Thank you for taking time to come for your Medicare Wellness Visit. I appreciate your ongoing commitment to your health goals. Please review the following plan we discussed and let me know if I can assist you in the future.   Screening recommendations/referrals: Colonoscopy: N/A  Recommended yearly ophthalmology/optometry visit for glaucoma screening and checkup Recommended yearly dental visit for hygiene and checkup  Vaccinations: Influenza vaccine: Due  Pneumococcal vaccine: Up to date  Tdap vaccine: Due  Shingles vaccine:    Advanced directives: yes   Conditions/risks identified: Advance Age > 84 yrs ,male Gender,Hypertension,Dyslidemia,Hx of smoking  Next appointment: 1 year   Preventive Care 84 Years and Older, Male Preventive care refers to lifestyle choices and visits with your health care provider that can promote health and wellness. What does preventive care include?  A yearly physical exam. This is also called an annual well check.  Dental exams once or twice a year.  Routine eye exams. Ask your health care provider how often you should have your eyes checked.  Personal lifestyle choices, including:  Daily care of your teeth and gums.  Regular physical activity.  Eating a healthy diet.  Avoiding tobacco and drug use.  Limiting alcohol use.  Practicing safe sex.  Taking low doses of aspirin every day.  Taking vitamin and mineral supplements as recommended by your health care provider. What happens during an annual well check? The services and screenings done by your health care provider during your annual well check will depend on your age, overall health, lifestyle risk factors, and family history of disease. Counseling  Your health care provider may ask you questions about your:  Alcohol use.  Tobacco use.  Drug use.  Emotional well-being.  Home and relationship well-being.  Sexual activity.  Eating habits.  History of  falls.  Memory and ability to understand (cognition).  Work and work Statistician. Screening  You may have the following tests or measurements:  Height, weight, and BMI.  Blood pressure.  Lipid and cholesterol levels. These may be checked every 5 years, or more frequently if you are over 23 years old.  Skin check.  Lung cancer screening. You may have this screening every year starting at age 84 if you have a 30-pack-year history of smoking and currently smoke or have quit within the past 15 years.  Fecal occult blood test (FOBT) of the stool. You may have this test every year starting at age 84  Flexible sigmoidoscopy or colonoscopy. You may have a sigmoidoscopy every 5 years or a colonoscopy every 10 years starting at age 84  Prostate cancer screening. Recommendations will vary depending on your family history and other risks.  Hepatitis C blood test.  Hepatitis B blood test.  Sexually transmitted disease (STD) testing.  Diabetes screening. This is done by checking your blood sugar (glucose) after you have not eaten for a while (fasting). You may have this done every 1-3 years.  Abdominal aortic aneurysm (AAA) screening. You may need this if you are a current or former smoker.  Osteoporosis. You may be screened starting at age 40 if you are at high risk. Talk with your health care provider about your test results, treatment options, and if necessary, the need for more tests. Vaccines  Your health care provider may recommend certain vaccines, such as:  Influenza vaccine. This is recommended every year.  Tetanus, diphtheria, and acellular pertussis (Tdap, Td) vaccine. You may need a Td booster every 10 years.  Zoster  vaccine. You may need this after age 84  Pneumococcal 13-valent conjugate (PCV13) vaccine. One dose is recommended after age 84  Pneumococcal polysaccharide (PPSV23) vaccine. One dose is recommended after age 84 Talk to your health care provider about  which screenings and vaccines you need and how often you need them. This information is not intended to replace advice given to you by your health care provider. Make sure you discuss any questions you have with your health care provider. Document Released: 02/21/2015 Document Revised: 10/15/2015 Document Reviewed: 11/26/2014 Elsevier Interactive Patient Education  2017 [employer] Prevention in the Home Falls can cause injuries. They can happen to people of all ages. There are many things you can do to make your home safe and to help prevent falls. What can I do on the outside of my home?  Regularly fix the edges of walkways and driveways and fix any cracks.  Remove anything that might make you trip as you walk through a door, such as a raised step or threshold.  Trim any bushes or trees on the path to your home.  Use bright outdoor lighting.  Clear any walking paths of anything that might make someone trip, such as rocks or tools.  Regularly check to see if handrails are loose or broken. Make sure that both sides of any steps have handrails.  Any raised decks and porches should have guardrails on the edges.  Have any leaves, snow, or ice cleared regularly.  Use sand or salt on walking paths during winter.  Clean up any spills in your garage right away. This includes oil or grease spills. What can I do in the bathroom?  Use night lights.  Install grab bars by the toilet and in the tub and shower. Do not use towel bars as grab bars.  Use non-skid mats or decals in the tub or shower.  If you need to sit down in the shower, use a plastic, non-slip stool.  Keep the floor dry. Clean up any water that spills on the floor as soon as it happens.  Remove soap buildup in the tub or shower regularly.  Attach bath mats securely with double-sided non-slip rug tape.  Do not have throw rugs and other things on the floor that can make you trip. What can I do in the  bedroom?  Use night lights.  Make sure that you have a light by your bed that is easy to reach.  Do not use any sheets or blankets that are too big for your bed. They should not hang down onto the floor.  Have a firm chair that has side arms. You can use this for support while you get dressed.  Do not have throw rugs and other things on the floor that can make you trip. What can I do in the kitchen?  Clean up any spills right away.  Avoid walking on wet floors.  Keep items that you use a lot in easy-to-reach places.  If you need to reach something above you, use a strong step stool that has a grab bar.  Keep electrical cords out of the way.  Do not use floor polish or wax that makes floors slippery. If you must use wax, use non-skid floor wax.  Do not have throw rugs and other things on the floor that can make you trip. What can I do with my stairs?  Do not leave any items on the stairs.  Make sure that there are handrails  on both sides of the stairs and use them. Fix handrails that are broken or loose. Make sure that handrails are as long as the stairways.  Check any carpeting to make sure that it is firmly attached to the stairs. Fix any carpet that is loose or worn.  Avoid having throw rugs at the top or bottom of the stairs. If you do have throw rugs, attach them to the floor with carpet tape.  Make sure that you have a light switch at the top of the stairs and the bottom of the stairs. If you do not have them, ask someone to add them for you. What else can I do to help prevent falls?  Wear shoes that:  Do not have high heels.  Have rubber bottoms.  Are comfortable and fit you well.  Are closed at the toe. Do not wear sandals.  If you use a stepladder:  Make sure that it is fully opened. Do not climb a closed stepladder.  Make sure that both sides of the stepladder are locked into place.  Ask someone to hold it for you, if possible.  Clearly mark and make  sure that you can see:  Any grab bars or handrails.  First and last steps.  Where the edge of each step is.  Use tools that help you move around (mobility aids) if they are needed. These include:  Canes.  Walkers.  Scooters.  Crutches.  Turn on the lights when you go into a dark area. Replace any light bulbs as soon as they burn out.  Set up your furniture so you have a clear path. Avoid moving your furniture around.  If any of your floors are uneven, fix them.  If there are any pets around you, be aware of where they are.  Review your medicines with your doctor. Some medicines can make you feel dizzy. This can increase your chance of falling. Ask your doctor what other things that you can do to help prevent falls. This information is not intended to replace advice given to you by your health care provider. Make sure you discuss any questions you have with your health care provider. Document Released: 11/21/2008 Document Revised: 07/03/2015 Document Reviewed: 03/01/2014 Elsevier Interactive Patient Education  2017 [employer].

## 2019-12-07 NOTE — Progress Notes (Signed)
    This service is provided via telemedicine  No vital signs collected/recorded due to the encounter was a telemedicine visit.   Location of patient (ex: home, work): Home.   Patient consents to a telephone visit: Yes.  Location of the provider (ex: office, home):  [facility].  Name of any referring provider: Reed, Tiffany L, DO   Names of all persons participating in the telemedicine service and their role in the encounter: Patient, Stanley Brooks, RMA, Ngetich, [name], NP.    Time spent on call: 8 minutes spent on the phone with Medical Assistant.   

## 2019-12-17 DIAGNOSIS — H353211 Exudative age-related macular degeneration, right eye, with active choroidal neovascularization: Secondary | ICD-10-CM | POA: Diagnosis not present

## 2019-12-17 DIAGNOSIS — H43391 Other vitreous opacities, right eye: Secondary | ICD-10-CM | POA: Diagnosis not present

## 2019-12-17 DIAGNOSIS — H43813 Vitreous degeneration, bilateral: Secondary | ICD-10-CM | POA: Diagnosis not present

## 2019-12-17 DIAGNOSIS — H353122 Nonexudative age-related macular degeneration, left eye, intermediate dry stage: Secondary | ICD-10-CM | POA: Diagnosis not present

## 2020-01-02 ENCOUNTER — Encounter: Payer: Self-pay | Admitting: Internal Medicine

## 2020-01-02 ENCOUNTER — Non-Acute Institutional Stay: Payer: Medicare PPO | Admitting: Internal Medicine

## 2020-01-02 ENCOUNTER — Other Ambulatory Visit: Payer: Self-pay

## 2020-01-02 VITALS — BP 140/72 | HR 78 | Temp 97.7°F | Ht 73.0 in | Wt 196.0 lb

## 2020-01-02 DIAGNOSIS — H3530 Unspecified macular degeneration: Secondary | ICD-10-CM | POA: Diagnosis not present

## 2020-01-02 DIAGNOSIS — E7800 Pure hypercholesterolemia, unspecified: Secondary | ICD-10-CM | POA: Diagnosis not present

## 2020-01-02 DIAGNOSIS — H9113 Presbycusis, bilateral: Secondary | ICD-10-CM | POA: Diagnosis not present

## 2020-01-02 DIAGNOSIS — R2689 Other abnormalities of gait and mobility: Secondary | ICD-10-CM | POA: Diagnosis not present

## 2020-01-02 DIAGNOSIS — E039 Hypothyroidism, unspecified: Secondary | ICD-10-CM

## 2020-01-02 DIAGNOSIS — I10 Essential (primary) hypertension: Secondary | ICD-10-CM

## 2020-01-02 NOTE — Progress Notes (Signed)
Location:  Occupational psychologist of Service:  Clinic (12)  Provider: Charlies Rayburn L. Mariea Clonts, D.O., C.M.D.  Code Status: DNR Goals of Care:  Advanced Directives 01/02/2020  Does Patient Have a Medical Advance Directive? Yes  Type of Paramedic of [place];Living will  Does patient want to make changes to medical advance directive? No - Patient declined  Copy of [place] in Chart? Yes - validated most recent copy scanned in chart (See row information)  Pre-existing out of facility DNR order (yellow form or pink MOST form) -     Chief Complaint  Patient presents with  . Medical Management of Chronic Issues    6 month follow up   . Health Maintenance    Tetanus/ Tdap    HPI: Patient is a 86 y.o. male seen today for medical management of chronic diseases.    Feels like he's about the same outside of not being as steady.  Has a ski pole that he uses at times, but often goes without it like today.  No falls lately since about 3 yrs ago.  He'd hurt his left elbow and it ahd been infected and that's when I'd seen him.   Eating well.  Sleeps very well.  He and his wife are going to [place] to eat with his family.  Then tomorrow they will eat with his wife's family in [place] and stay thru Friday.    His hearing is still not very good.  He is planning to follow-up about his hearing aid not working like he'd like.  Goes to [employer] site on [address].    BP ok today.  Does not check at home.   No headaches or dizziness.    Admits his memory is not as good as it was.  Cannot remember names easily.  Takes a bit to come back.  No difficulty with missed appts or meds.  Takes meds--sets them out to remember them at certain points.  Still plays bridge.  Says he used to be sharper with it.    He goes to exercise.  They went to the [employer].  They have dinner with friends.  Sometimes meet outside for meals.  Does  a men's breakfast.    Going to retina specialist for shots for his macular degeneration--right eye is a bigger problem than left but both are affected.  Says his reading is slow.  Returns in about 5 wks now.  Goes q6 wks.    Discussed need for shingrix and tdap which are given at the pharmacy.  He's up to date on all vaccines that can be given here including his flu and covid booster.  Past Medical History:  Diagnosis Date  . Cataract 2008   left and right eyde By Dr. Jodi Mourning   . DVT (deep venous thrombosis) ([place])   . Exudative age-related macular degeneration of right eye with active choroidal neovascularization ([place])   . Hypercholesterolemia   . Hypertension   . Hypothyroid   . Kidney stone   . Vitreous detachment, bilateral 03/15/2019    Past Surgical History:  Procedure Laterality Date  . CATARACT EXTRACTION W/ INTRAOCULAR LENS IMPLANT Bilateral   . COLONOSCOPY     Dr. Alvia Grove, prior polyps, but last due to age  . HEMORROIDECTOMY  1984   Dr. Shawnie Dapper  . Plateau repair of right leg  Right   . TOTAL HIP ARTHROPLASTY      Allergies  Allergen Reactions  . Neosporin [Neomycin-Bacitracin Zn-Polymyx] Dermatitis  . Penicillins     Outpatient Encounter Medications as of 01/02/2020  Medication Sig  . atorvastatin (LIPITOR) 20 MG tablet TAKE ONE TABLET BY MOUTH EACH NIGHT AT BEDTIME  . levothyroxine (SYNTHROID) 100 MCG tablet TAKE 1 TABLET BY MOUTH EVERY DAY BEFORE BREAKFAST  . lisinopril (ZESTRIL) 10 MG tablet TAKE 1 TABLET BY MOUTH EVERY DAY  . Multiple Vitamins-Minerals (PRESERVISION AREDS PO) Take by mouth 2 (two) times a day.   . sildenafil (VIAGRA) 100 MG tablet Take 1 tablet (100 mg total) by mouth daily as needed for erectile dysfunction.  Alveda Reasons 20 MG TABS tablet TAKE 1 TABLET BY MOUTH EVERY DAY WITH EVENING MEAL   No facility-administered encounter medications on file as of 01/02/2020.    Review of Systems:  Review of Systems  Constitutional: Negative for chills,  fever and malaise/fatigue.  HENT: Positive for hearing loss. Negative for congestion and sore throat.   Eyes: Positive for blurred vision.  Respiratory: Negative for cough, shortness of breath and wheezing.   Cardiovascular: Negative for chest pain, palpitations and leg swelling.  Gastrointestinal: Negative for abdominal pain, blood in stool, constipation, diarrhea and melena.  Genitourinary: Negative for dysuria.       1x nocturia only  Musculoskeletal: Negative for falls and joint pain.  Skin: Negative for itching and rash.  Neurological: Negative for dizziness, loss of consciousness, weakness and headaches.       Gait unsteady  Psychiatric/Behavioral: Positive for memory loss. Negative for depression. The patient is not nervous/anxious and does not have insomnia.     Health Maintenance  Topic Date Due  . TETANUS/TDAP  Never done  . INFLUENZA VACCINE  Completed  . COVID-19 Vaccine  Completed  . PNA vac Low Risk Adult  Completed    Physical Exam: Vitals:   01/02/20 1106  BP: 140/72  Pulse: 78  Temp: 97.7 F (36.5 C)  TempSrc: Temporal  SpO2: 98%  Weight: 196 lb (88.9 kg)  Height: 6\' 1"  (1.854 m)   Body mass index is 25.86 kg/m. Physical Exam Vitals reviewed.  Constitutional:      General: He is not in acute distress.    Appearance: Normal appearance. He is not toxic-appearing.  HENT:     Head: Normocephalic and atraumatic.     Right Ear: Tympanic membrane, ear canal and external ear normal.     Left Ear: Tympanic membrane, ear canal and external ear normal.     Ears:     Comments: Blue plastic tube in right ear; has bilateral hearing aids    Nose: Nose normal.     Mouth/Throat:     Mouth: Mucous membranes are moist.     Pharynx: Oropharynx is clear.  Eyes:     Extraocular Movements: Extraocular movements intact.     Conjunctiva/sclera: Conjunctivae normal.     Pupils: Pupils are equal, round, and reactive to light.  Cardiovascular:     Rate and Rhythm: Normal  rate and regular rhythm.     Heart sounds: No murmur heard.   Pulmonary:     Effort: Pulmonary effort is normal.     Breath sounds: Normal breath sounds. No wheezing, rhonchi or rales.  Abdominal:     General: Bowel sounds are normal.     Tenderness: There is no abdominal tenderness.  Musculoskeletal:        General: Normal range of motion.     Cervical back: Neck supple.  Right lower leg: No edema.     Left lower leg: No edema.  Lymphadenopathy:     Cervical: No cervical adenopathy.  Skin:    General: Skin is warm and dry.     Capillary Refill: Capillary refill takes less than 2 seconds.  Neurological:     General: No focal deficit present.     Mental Status: He is alert and oriented to person, place, and time.     Motor: No weakness.     Gait: Gait abnormal.     Comments: Unsteady when first stands  Psychiatric:        Mood and Affect: Mood normal.        Behavior: Behavior normal.     Labs reviewed: Basic Metabolic Panel: Recent Labs    06/28/19 0000 06/28/19 0700  NA  --  139  K  --  4.5  CL  --  107  CO2  --  18  BUN  --  28*  CREATININE  --  2.2*  CALCIUM 9.2  --   TSH 1.57  --    Liver Function Tests: Recent Labs    06/28/19 0000  ALBUMIN 4.2   No results for input(s): LIPASE, AMYLASE in the last 8760 hours. No results for input(s): AMMONIA in the last 8760 hours. CBC: Recent Labs    06/28/19 0700  WBC 4.1  HGB 15.0  HCT 15*  PLT 140*   Lipid Panel: Recent Labs    06/28/19 0000  CHOL 183  HDL 43  LDLCALC 113  TRIG 134   No results found for: HGBA1C   Assessment/Plan 1. Balance problem -no falls x 3 yrs, but admits to this -stand up slowly and pause before moving -do exercises previously provided by therapy (has not been lately) -use walking stick  2. Acquired hypothyroidism -cont current levothyroxine, plan to f/u labs after I see him next  3. Pure hypercholesterolemia -cont lipitor, tolerating ok  4. Essential  hypertension -bp at goal considering age and fall risk -cont lisinopril  5. Presbycusis of both ears -progressed, f/u with audiology -did not have cerumen impaction  6. Macular degeneration, unspecified laterality, unspecified type -cont to follow with ophtho for injections   Labs/tests ordered:  Bmp next tues Next appt:  F/u in 6 mos in [facility]  Virgil Slinger L. Aunesti Pellegrino, D.O. [facility] [address], [place] [address] Cell Phone (Mon-Fri 8am-5pm):  [phone] On Call:  [phone] & follow prompts after 5pm & weekends Office Phone:  [phone] Office Fax:  [phone]

## 2020-01-02 NOTE — Patient Instructions (Signed)
I recommend you get your shingrix series done at the pharmacy.

## 2020-01-08 DIAGNOSIS — I10 Essential (primary) hypertension: Secondary | ICD-10-CM | POA: Diagnosis not present

## 2020-01-08 LAB — COMPREHENSIVE METABOLIC PANEL: Calcium: 8.9 (ref 8.7–10.7)

## 2020-01-08 LAB — BASIC METABOLIC PANEL
BUN: 30 — AB (ref 4–21)
CO2: 22 (ref 13–22)
Chloride: 107 (ref 99–108)
Creatinine: 2.2 — AB (ref 0.6–1.3)
Glucose: 95
Potassium: 4.6 (ref 3.4–5.3)
Sodium: 139 (ref 137–147)

## 2020-01-09 ENCOUNTER — Telehealth: Payer: Self-pay

## 2020-01-09 NOTE — Telephone Encounter (Signed)
Message for patient to call back for lab results: Per Dr Mariea Clonts Encourage him to drink more water to help hid kidneys.

## 2020-01-10 ENCOUNTER — Encounter: Payer: Self-pay | Admitting: Internal Medicine

## 2020-01-14 ENCOUNTER — Other Ambulatory Visit: Payer: Self-pay

## 2020-01-14 ENCOUNTER — Encounter: Payer: Self-pay | Admitting: Family

## 2020-01-14 ENCOUNTER — Ambulatory Visit (INDEPENDENT_AMBULATORY_CARE_PROVIDER_SITE_OTHER): Payer: Medicare PPO | Admitting: Family

## 2020-01-14 VITALS — BP 138/88 | HR 73 | Temp 98.6°F | Resp 16 | Ht 73.0 in | Wt 197.0 lb

## 2020-01-14 DIAGNOSIS — W19XXXA Unspecified fall, initial encounter: Secondary | ICD-10-CM

## 2020-01-14 DIAGNOSIS — R0781 Pleurodynia: Secondary | ICD-10-CM | POA: Diagnosis not present

## 2020-01-14 NOTE — Progress Notes (Signed)
Provider: Forever Arechiga FNP-C  Gayland Curry, DO  Patient Care Team: Gayland Curry, DO as PCP - General (Geriatric Medicine) Desma Maxim, MD as Referring Physician (Ophthalmology) Melida Quitter, MD as Consulting Physician (Otolaryngology)  Extended Emergency Contact Information Primary Emergency Contact: Doristine Bosworth Mobile Phone: [phone] Relation: Spouse  Code Status:  DNR Goals of care: Advanced Directive information Advanced Directives 01/14/2020  Does Patient Have a Medical Advance Directive? Yes  Type of Paramedic of [place];Living will;Out of facility DNR (pink MOST or yellow form)  Does patient want to make changes to medical advance directive? No - Patient declined  Copy of [place] in Chart? Yes - validated most recent copy scanned in chart (See row information)  Pre-existing out of facility DNR order (yellow form or pink MOST form) Yellow form placed in chart (order not valid for inpatient use);Pink MOST form placed in chart (order not valid for inpatient use)     Chief Complaint  Patient presents with  . Acute Visit    Patient had fall saturday and right side is hurting.   . Concern    Moderate Fall Risk.    HPI:  Pt is a 86 y.o. male seen today for an acute visit for evaluation of right side pain.He states had a drink after supper on Saturday got up to get to TV lost balance and hit right side of the cheek,right chest side.Thinks he hit the chair arm or chest drawer.Took Aleve on Saturday night.He slept well Saturday and Sunday but has sharp pain.Pain worst with movement.does not hurt much with deep breathing.He was seen by facility Nurse at [facility] today vital signs checked were normal.he was advised to be seen by provider at [facility].He denies any cough,wheezing or shortness of breath.   Past Medical History:  Diagnosis Date  . Cataract 2008   left and right eyde By Dr. Jodi Mourning   . DVT (deep venous  thrombosis) ([place])   . Exudative age-related macular degeneration of right eye with active choroidal neovascularization ([place])   . Hypercholesterolemia   . Hypertension   . Hypothyroid   . Kidney stone   . Vitreous detachment, bilateral 03/15/2019   Past Surgical History:  Procedure Laterality Date  . CATARACT EXTRACTION W/ INTRAOCULAR LENS IMPLANT Bilateral   . COLONOSCOPY     Dr. Alvia Grove, prior polyps, but last due to age  . HEMORROIDECTOMY  1984   Dr. Shawnie Dapper  . Plateau repair of right leg  Right   . TOTAL HIP ARTHROPLASTY      Allergies  Allergen Reactions  . Neosporin [Neomycin-Bacitracin Zn-Polymyx] Dermatitis  . Penicillins     Outpatient Encounter Medications as of 01/14/2020  Medication Sig  . atorvastatin (LIPITOR) 20 MG tablet TAKE ONE TABLET BY MOUTH EACH NIGHT AT BEDTIME  . levothyroxine (SYNTHROID) 100 MCG tablet TAKE 1 TABLET BY MOUTH EVERY DAY BEFORE BREAKFAST  . lisinopril (ZESTRIL) 10 MG tablet TAKE 1 TABLET BY MOUTH EVERY DAY  . Multiple Vitamins-Minerals (PRESERVISION AREDS PO) Take by mouth 2 (two) times a day.   . sildenafil (VIAGRA) 100 MG tablet Take 1 tablet (100 mg total) by mouth daily as needed for erectile dysfunction.  Alveda Reasons 20 MG TABS tablet TAKE 1 TABLET BY MOUTH EVERY DAY WITH EVENING MEAL   No facility-administered encounter medications on file as of 01/14/2020.    Review of Systems  Constitutional: Negative for appetite change, chills, fatigue and fever.  Respiratory: Negative for cough, chest  tightness, shortness of breath and wheezing.   Cardiovascular: Negative for chest pain, palpitations and leg swelling.  Musculoskeletal: Positive for arthralgias and gait problem. Negative for back pain.  Skin: Negative for color change and pallor.  Neurological: Negative for dizziness, speech difficulty, weakness, light-headedness, numbness and headaches.  Hematological: Bruises/bleeds easily.  Psychiatric/Behavioral: Negative for agitation,  confusion and sleep disturbance. The patient is not nervous/anxious.     Immunization History  Administered Date(s) Administered  . Fluad Quad(high Dose 65+) 12/18/2019  . Influenza, High Dose Seasonal PF 11/28/2014, 12/12/2017, 10/08/2018  . Influenza-Unspecified 12/08/2016  . Moderna SARS-COVID-2 Vaccination 02/20/2019, 03/20/2019, 12/25/2019  . Pneumococcal Conjugate-13 01/19/2017  . Pneumococcal Polysaccharide-23 06/14/2018   Pertinent  Health Maintenance Due  Topic Date Due  . INFLUENZA VACCINE  Completed  . PNA vac Low Risk Adult  Completed   Fall Risk  01/14/2020 01/02/2020 01/02/2020 12/07/2019 07/04/2019  Falls in the past year? 1 0 0 0 0  Number falls in past yr: 0 0 0 0 0  Injury with Fall? 1 0 0 0 0   Functional Status Survey:    Vitals:   01/14/20 1439  BP: 138/88  Pulse: 73  Resp: 16  Temp: 98.6 F (37 C)  SpO2: 92%  Weight: 197 lb (89.4 kg)  Height: 6\' 1"  (1.854 m)   Body mass index is 25.99 kg/m. Physical Exam Vitals reviewed.  Constitutional:      General: He is not in acute distress.    Appearance: He is not ill-appearing.  HENT:     Head: Normocephalic.     Nose: Nose normal. No congestion or rhinorrhea.     Mouth/Throat:     Mouth: Mucous membranes are moist.     Pharynx: Oropharynx is clear. No oropharyngeal exudate or posterior oropharyngeal erythema.  Eyes:     General: No scleral icterus.       Right eye: No discharge.        Left eye: No discharge.     Extraocular Movements: Extraocular movements intact.     Conjunctiva/sclera: Conjunctivae normal.     Pupils: Pupils are equal, round, and reactive to light.  Neck:     Vascular: No carotid bruit.  Cardiovascular:     Rate and Rhythm: Normal rate and regular rhythm.     Pulses: Normal pulses.     Heart sounds: Normal heart sounds. No murmur heard.  No friction rub. No gallop.   Pulmonary:     Effort: Pulmonary effort is normal. No respiratory distress.     Breath sounds: Normal  breath sounds. No wheezing, rhonchi or rales.     Comments: Right rib cage tender to palpitation no mass or swelling noted.No bruise noted.   Abdominal:     General: Bowel sounds are normal. There is no distension.     Palpations: Abdomen is soft. There is no mass.     Tenderness: There is no abdominal tenderness. There is no right CVA tenderness, left CVA tenderness, guarding or rebound.  Musculoskeletal:        General: No swelling or tenderness.     Cervical back: Normal range of motion. No rigidity or tenderness.     Right lower leg: No edema.     Left lower leg: No edema.     Comments: Unsteady gait   Lymphadenopathy:     Cervical: No cervical adenopathy.  Skin:    General: Skin is warm and dry.     Coloration: Skin is not pale.  Findings: No bruising, erythema or rash.     Comments: Right cheek small laceration healing well   Neurological:     Mental Status: He is alert and oriented to person, place, and time.     Cranial Nerves: No cranial nerve deficit.     Sensory: No sensory deficit.     Motor: No weakness.     Coordination: Coordination normal.     Gait: Gait abnormal.  Psychiatric:        Mood and Affect: Mood normal.        Behavior: Behavior normal.        Thought Content: Thought content normal.        Judgment: Judgment normal.     Labs reviewed: Recent Labs    06/28/19 0000 06/28/19 0700 01/08/20 0000  NA  --  139 139  K  --  4.5 4.6  CL  --  107 107  CO2  --  18 22  BUN  --  28* 30*  CREATININE  --  2.2* 2.2*  CALCIUM 9.2  --  8.9   Recent Labs    06/28/19 0000  ALBUMIN 4.2   Recent Labs    06/28/19 0700  WBC 4.1  HGB 15.0  HCT 15*  PLT 140*   Lab Results  Component Value Date   TSH 1.57 06/28/2019   No results found for: HGBA1C Lab Results  Component Value Date   CHOL 183 06/28/2019   HDL 43 06/28/2019   LDLCALC 113 06/28/2019   TRIG 134 06/28/2019    Significant Diagnostic Results in last 30 days:  No results found.   Assessment/Plan 1. Rib pain on right side Rib cage tenderness to palpation post fall hitting chair arm/dresser drawer. No dyspnea or shortness of breath.No bruise noted. Will obtain rib cage x-ray to rule out acute abnormalities. - Advised to get right rib cage X-ray at [place] at [facility] over [address] [place]. - [place] Ribs Unilateral Right; Future  2. Fall, initial encounter [place] after drinking wine after supper.Hititng right rib cage on arm chair/dresser drawer. - Fall and safety precaution  - Alcohol use caution - DG Ribs Unilateral Right; Future  Family/ staff Communication: Reviewed plan of care with patient  Labs/tests ordered:  - DG Ribs Unilateral Right; Future Next Appointment:   Sandrea Hughs, NP

## 2020-01-14 NOTE — Patient Instructions (Signed)
-   Please get right side/rib cage X-ray done at [place] at [facility] over [address].will call you with results. - take Tylenol 500 mg tablet one by three times daily  as needed for pain

## 2020-01-15 ENCOUNTER — Ambulatory Visit
Admission: RE | Admit: 2020-01-15 | Discharge: 2020-01-15 | Disposition: A | Discharge status: Home / Self Care | Payer: Medicare Other | Source: Ambulatory Visit | Attending: Family | Admitting: Family

## 2020-01-15 DIAGNOSIS — R0781 Pleurodynia: Secondary | ICD-10-CM | POA: Diagnosis not present

## 2020-01-15 DIAGNOSIS — W19XXXA Unspecified fall, initial encounter: Secondary | ICD-10-CM

## 2020-01-18 ENCOUNTER — Telehealth: Payer: Self-pay

## 2020-01-18 NOTE — Telephone Encounter (Signed)
Patient saw [name] in the office on Monday. Daughter called and is concerned about the blood pooling, though suspects it is due to Xarelto.  Daughter is asking about if/when he should follow up (other than having [facility] nurse look at it). There is no note in [name]'s note about F/U.  Next appointment is for 06/2020.  I will send [name] this also since she was the one to see him.   Daughter sent me a picture via my email and I have forwarded it to you via [place].

## 2020-02-07 DIAGNOSIS — H353122 Nonexudative age-related macular degeneration, left eye, intermediate dry stage: Secondary | ICD-10-CM | POA: Diagnosis not present

## 2020-02-07 DIAGNOSIS — H353211 Exudative age-related macular degeneration, right eye, with active choroidal neovascularization: Secondary | ICD-10-CM | POA: Diagnosis not present

## 2020-03-27 DIAGNOSIS — H43813 Vitreous degeneration, bilateral: Secondary | ICD-10-CM | POA: Diagnosis not present

## 2020-03-27 DIAGNOSIS — H353122 Nonexudative age-related macular degeneration, left eye, intermediate dry stage: Secondary | ICD-10-CM | POA: Diagnosis not present

## 2020-03-27 DIAGNOSIS — H43391 Other vitreous opacities, right eye: Secondary | ICD-10-CM | POA: Diagnosis not present

## 2020-03-27 DIAGNOSIS — H353211 Exudative age-related macular degeneration, right eye, with active choroidal neovascularization: Secondary | ICD-10-CM | POA: Diagnosis not present

## 2020-03-31 ENCOUNTER — Encounter: Payer: Self-pay | Admitting: Internal Medicine

## 2020-04-22 ENCOUNTER — Other Ambulatory Visit: Payer: Self-pay | Admitting: Internal Medicine

## 2020-04-23 DIAGNOSIS — H6123 Impacted cerumen, bilateral: Secondary | ICD-10-CM | POA: Diagnosis not present

## 2020-04-23 DIAGNOSIS — H6991 Unspecified Eustachian tube disorder, right ear: Secondary | ICD-10-CM | POA: Insufficient documentation

## 2020-05-06 ENCOUNTER — Other Ambulatory Visit: Payer: Self-pay | Admitting: Internal Medicine

## 2020-05-13 ENCOUNTER — Other Ambulatory Visit: Payer: Self-pay

## 2020-05-13 MED ORDER — RIVAROXABAN 20 MG PO TABS
ORAL_TABLET | ORAL | 2 refills | Status: DC
Start: 2020-05-13 — End: 2020-09-01

## 2020-05-19 DIAGNOSIS — Z85820 Personal history of malignant melanoma of skin: Secondary | ICD-10-CM | POA: Diagnosis not present

## 2020-05-19 DIAGNOSIS — Z85828 Personal history of other malignant neoplasm of skin: Secondary | ICD-10-CM | POA: Diagnosis not present

## 2020-05-19 DIAGNOSIS — Z08 Encounter for follow-up examination after completed treatment for malignant neoplasm: Secondary | ICD-10-CM | POA: Diagnosis not present

## 2020-05-19 DIAGNOSIS — L821 Other seborrheic keratosis: Secondary | ICD-10-CM | POA: Diagnosis not present

## 2020-05-19 DIAGNOSIS — C44619 Basal cell carcinoma of skin of left upper limb, including shoulder: Secondary | ICD-10-CM | POA: Diagnosis not present

## 2020-05-19 DIAGNOSIS — D1801 Hemangioma of skin and subcutaneous tissue: Secondary | ICD-10-CM | POA: Diagnosis not present

## 2020-05-22 DIAGNOSIS — H353124 Nonexudative age-related macular degeneration, left eye, advanced atrophic with subfoveal involvement: Secondary | ICD-10-CM | POA: Diagnosis not present

## 2020-05-22 DIAGNOSIS — H33322 Round hole, left eye: Secondary | ICD-10-CM | POA: Diagnosis not present

## 2020-05-22 DIAGNOSIS — H43813 Vitreous degeneration, bilateral: Secondary | ICD-10-CM | POA: Diagnosis not present

## 2020-05-22 DIAGNOSIS — H353211 Exudative age-related macular degeneration, right eye, with active choroidal neovascularization: Secondary | ICD-10-CM | POA: Diagnosis not present

## 2020-05-28 DIAGNOSIS — H53413 Scotoma involving central area, bilateral: Secondary | ICD-10-CM | POA: Diagnosis not present

## 2020-06-04 DIAGNOSIS — H53413 Scotoma involving central area, bilateral: Secondary | ICD-10-CM | POA: Diagnosis not present

## 2020-06-05 ENCOUNTER — Other Ambulatory Visit: Payer: Self-pay | Admitting: Internal Medicine

## 2020-07-02 ENCOUNTER — Non-Acute Institutional Stay: Payer: Medicare PPO | Admitting: Internal Medicine

## 2020-07-02 ENCOUNTER — Other Ambulatory Visit: Payer: Self-pay

## 2020-07-02 ENCOUNTER — Encounter: Payer: Self-pay | Admitting: Internal Medicine

## 2020-07-02 VITALS — BP 138/90 | HR 70 | Temp 97.8°F | Ht 73.0 in | Wt 195.8 lb

## 2020-07-02 DIAGNOSIS — S2241XD Multiple fractures of ribs, right side, subsequent encounter for fracture with routine healing: Secondary | ICD-10-CM

## 2020-07-02 DIAGNOSIS — H3530 Unspecified macular degeneration: Secondary | ICD-10-CM

## 2020-07-02 DIAGNOSIS — E7800 Pure hypercholesterolemia, unspecified: Secondary | ICD-10-CM

## 2020-07-02 DIAGNOSIS — I82402 Acute embolism and thrombosis of unspecified deep veins of left lower extremity: Secondary | ICD-10-CM

## 2020-07-02 DIAGNOSIS — I10 Essential (primary) hypertension: Secondary | ICD-10-CM

## 2020-07-02 DIAGNOSIS — R2689 Other abnormalities of gait and mobility: Secondary | ICD-10-CM | POA: Diagnosis not present

## 2020-07-02 DIAGNOSIS — W19XXXS Unspecified fall, sequela: Secondary | ICD-10-CM | POA: Diagnosis not present

## 2020-07-02 DIAGNOSIS — E039 Hypothyroidism, unspecified: Secondary | ICD-10-CM

## 2020-07-02 DIAGNOSIS — N529 Male erectile dysfunction, unspecified: Secondary | ICD-10-CM

## 2020-07-02 MED ORDER — LEVOTHYROXINE SODIUM 100 MCG PO TABS: ORAL_TABLET | ORAL | 1 refills | Status: DC

## 2020-07-02 NOTE — Progress Notes (Signed)
Location:  [place] of Service:  Clinic (12)  Provider:   Code Status:  Goals of Care:  Advanced Directives 07/02/2020  Does Patient Have a Medical Advance Directive? Yes  Type of Paramedic of [place];Living will  Does patient want to make changes to medical advance directive? No - Patient declined  Copy of [place] in Chart? Yes - validated most recent copy scanned in chart (See row information)  Pre-existing out of facility DNR order (yellow form or pink MOST form) -     Chief Complaint  Patient presents with  . Medical Management of Chronic Issues    Patient returns to the clinic for follow up and a refill on his levothyroxine.     HPI: Patient is a 87 y.o. male seen today for medical management of chronic diseases.   Had one fall ? Mechanical. Losing the footing in 12/21 Sustaining multiple rib injuries on the right side. States he has done well since then and has not had any falls.  Per Dr. Cyndi Lennert note continues to have history of balance issues  Has history of macular degeneration.  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  He lives with his wife.  Continues to be independent.  Past Medical History:  Diagnosis Date  . Cataract 2008   left and right eyde By Dr. Jodi Mourning   . DVT (deep venous thrombosis) ([place])   . Exudative age-related macular degeneration of right eye with active choroidal neovascularization ([place])   . Hypercholesterolemia   . Hypertension   . Hypothyroid   . Kidney stone   . Vitreous detachment, bilateral 03/15/2019    Past Surgical History:  Procedure Laterality Date  . CATARACT EXTRACTION W/ INTRAOCULAR LENS IMPLANT Bilateral   . COLONOSCOPY     Dr. Alvia Grove, prior polyps, but  last due to age  . HEMORROIDECTOMY  1984   Dr. Shawnie Dapper  . Plateau repair of right leg  Right   . TOTAL HIP ARTHROPLASTY      Allergies  Allergen Reactions  . Neosporin [Neomycin-Bacitracin Zn-Polymyx] Dermatitis  . Penicillins     Outpatient Encounter Medications as of 07/02/2020  Medication Sig  . atorvastatin (LIPITOR) 20 MG tablet TAKE ONE TABLET BY MOUTH EACH NIGHT AT BEDTIME  . lisinopril (ZESTRIL) 10 MG tablet TAKE 1 TABLET BY MOUTH EVERY DAY  . Multiple Vitamins-Minerals (PRESERVISION AREDS PO) Take by mouth 2 (two) times a day.   . rivaroxaban (XARELTO) 20 MG TABS tablet TAKE 1 TABLET BY MOUTH EVERY DAY WITH EVENING MEAL  . sildenafil (VIAGRA) 100 MG tablet Take 1 tablet (100 mg total) by mouth daily as needed for erectile dysfunction.  . [DISCONTINUED] levothyroxine (SYNTHROID) 100 MCG tablet TAKE 1 TABLET BY MOUTH EVERY DAY BEFORE BREAKFAST  . levothyroxine (SYNTHROID) 100 MCG tablet TAKE 1 TABLET BY MOUTH EVERY DAY BEFORE BREAKFAST   No facility-administered encounter medications on file as of 07/02/2020.    Review of Systems:  Review of Systems  Review of Systems  Constitutional: Negative for activity change, appetite change, chills, diaphoresis, fatigue and fever.  HENT: Negative for mouth sores, postnasal drip, rhinorrhea, sinus pain and sore throat.   Respiratory: Negative for apnea, cough, chest tightness, shortness of breath and wheezing.   Cardiovascular: Negative for chest pain, palpitations  and leg swelling.  Gastrointestinal: Negative for abdominal distention, abdominal pain, constipation, diarrhea, nausea and vomiting.  Genitourinary: Negative for dysuria and frequency.  Musculoskeletal: Negative for arthralgias, joint swelling and myalgias.  Skin: Negative for rash.  Neurological: Negative for dizziness, syncope, weakness, light-headedness and numbness.  Psychiatric/Behavioral: Negative for behavioral problems, confusion and sleep disturbance.     Health  Maintenance  Topic Date Due  . TETANUS/TDAP  Never done  . INFLUENZA VACCINE  09/08/2020  . COVID-19 Vaccine  Completed  . PNA vac Low Risk Adult  Completed  . HPV VACCINES  Aged Out    Physical Exam: Vitals:   07/02/20 1104  BP: 138/90  Pulse: 70  Temp: 97.8 F (36.6 C)  SpO2: 98%  Weight: 195 lb 12.8 oz (88.8 kg)  Height: '6\' 1"'$  (1.854 m)   Body mass index is 25.83 kg/m. Physical Exam Constitutional: Oriented to person, place, and time. Well-developed and well-nourished.  HENT:  Head: Normocephalic.  Mouth/Throat: Oropharynx is clear and moist.  Eyes: Pupils are equal, round, and reactive to light.  Neck: Neck supple.  Cardiovascular: Normal rate and normal heart sounds.  No murmur heard. Pulmonary/Chest: Effort normal and breath sounds normal. No respiratory distress. No wheezes.  has no rales.  Abdominal: Soft. Bowel sounds are normal. No distension. There is no tenderness. There is no rebound.  Musculoskeletal: No edema.  Lymphadenopathy: none Neurological: Alert and oriented to person, place, and time.  Gait seemed stable No Focal Deficits Skin: Skin is warm and dry.  Psychiatric: Normal mood and affect. Behavior is normal. Thought content normal.   Labs reviewed: Basic Metabolic Panel: Recent Labs    01/08/20 0000  NA 139  K 4.6  CL 107  CO2 22  BUN 30*  CREATININE 2.2*  CALCIUM 8.9   Liver Function Tests: No results for input(s): AST, ALT, ALKPHOS, BILITOT, PROT, ALBUMIN in the last 8760 hours. No results for input(s): LIPASE, AMYLASE in the last 8760 hours. No results for input(s): AMMONIA in the last 8760 hours. CBC: No results for input(s): WBC, NEUTROABS, HGB, HCT, MCV, PLT in the last 8760 hours. Lipid Panel: No results for input(s): CHOL, HDL, LDLCALC, TRIG, CHOLHDL, LDLDIRECT in the last 8760 hours. No results found for: HGBA1C  Procedures since last visit: No results found.  Assessment/Plan 1. Closed fracture of multiple ribs of right  side with routine healing, subsequent encounter Not Having any issues anymore. No Pain or SOB or Cough  2. Fall, sequela Discussed again to be aware of his balance issues and use cane or other assist device  3. Balance problem Use assist device  4. Acquired hypothyroidism Repeat TSH  5. Pure hypercholesterolemia Repeat Lipid Panel  6. Essential hypertension Stable on Lisinopril  7. Macular degeneration, unspecified laterality, unspecified type Follows with Opthlamologist  8. Erectile dysfunction, unspecified erectile dysfunction type Viagra PRN  9 H/o Recurent Leg DVT On Xarelto  10 Need for Vaccination Covid Booster, TDAP and Shigrix  Labs/tests ordered:  CBC,CMP,Lipid Panel,TSH Next appt:  12/08/2020

## 2020-07-08 LAB — CBC AND DIFFERENTIAL
HCT: 45 (ref 41–53)
Hemoglobin: 15.4 (ref 13.5–17.5)
Platelets: 124 — AB (ref 150–399)
WBC: 3.9

## 2020-07-08 LAB — BASIC METABOLIC PANEL
BUN: 32 — AB (ref 4–21)
CO2: 22 (ref 13–22)
Chloride: 104 (ref 99–108)
Creatinine: 2.6 — AB (ref 0.6–1.3)
Glucose: 87
Potassium: 4.2 (ref 3.4–5.3)
Sodium: 141 (ref 137–147)

## 2020-07-08 LAB — LIPID PANEL
Cholesterol: 200 (ref 0–200)
HDL: 49 (ref 35–70)
LDL Cholesterol: 126
LDl/HDL Ratio: 4.1
Triglycerides: 125 (ref 40–160)

## 2020-07-08 LAB — COMPREHENSIVE METABOLIC PANEL
Albumin: 4.6 (ref 3.5–5.0)
Calcium: 9.1 (ref 8.7–10.7)
Globulin: 2.3

## 2020-07-08 LAB — HEPATIC FUNCTION PANEL
ALT: 16 (ref 10–40)
AST: 22 (ref 14–40)
Alkaline Phosphatase: 123 (ref 25–125)
Bilirubin, Total: 0.4

## 2020-07-08 LAB — CBC: RBC: 4.77 (ref 3.87–5.11)

## 2020-07-08 LAB — TSH: TSH: 2.57 (ref 0.41–5.90)

## 2020-07-09 ENCOUNTER — Telehealth: Payer: Self-pay | Admitting: Internal Medicine

## 2020-07-09 NOTE — Telephone Encounter (Signed)
Pt called to update that he tested positive for COVID thru home test on 07/08/20.  Did a 2nd test at [facility] this am(07/09/20) & waiting for them to call with results.  Symptoms: Coughing, runny nose, some chest pain  His wife tested positive last week & better now.  Would like a call from someone to reassure & give advice on what he should be doing or what meds he should be taking.  Lattie Haw

## 2020-07-09 NOTE — Telephone Encounter (Signed)
Patient with Runny nose and Cough No Fever or SOB He is on Xarelto and with mild Symptoms will hold Paxlovid . If his symptoms get worse he will call and Consider holding Xarelto Also told to go to ED if any Fever or SOB Rest , PO Hydration and Mucinex Tylenol as needed

## 2020-07-10 ENCOUNTER — Telehealth: Payer: Self-pay

## 2020-07-10 NOTE — Telephone Encounter (Signed)
Virgie Dad, MD  [place], [place] Call Mr Kenan, Lu 1926/01/28  Follow with him for his symptoms he just had covid  Also Let him know that his Renal Function is stable.  Cholesterol is slightly high but will discuss in Next Appointment.   Anjali   Called patient, he states he is feeling okay. He is still coughing and blowing nose, but no pain or temperature.  To Dr. Lyndel Safe

## 2020-08-13 DIAGNOSIS — H6123 Impacted cerumen, bilateral: Secondary | ICD-10-CM | POA: Diagnosis not present

## 2020-08-14 DIAGNOSIS — H43813 Vitreous degeneration, bilateral: Secondary | ICD-10-CM | POA: Diagnosis not present

## 2020-08-14 DIAGNOSIS — H35371 Puckering of macula, right eye: Secondary | ICD-10-CM | POA: Diagnosis not present

## 2020-08-14 DIAGNOSIS — H353124 Nonexudative age-related macular degeneration, left eye, advanced atrophic with subfoveal involvement: Secondary | ICD-10-CM | POA: Diagnosis not present

## 2020-08-14 DIAGNOSIS — H353211 Exudative age-related macular degeneration, right eye, with active choroidal neovascularization: Secondary | ICD-10-CM | POA: Diagnosis not present

## 2020-08-30 ENCOUNTER — Other Ambulatory Visit: Payer: Self-pay | Admitting: Internal Medicine

## 2020-09-02 ENCOUNTER — Ambulatory Visit (INDEPENDENT_AMBULATORY_CARE_PROVIDER_SITE_OTHER): Payer: Medicare PPO | Admitting: Family

## 2020-09-02 ENCOUNTER — Other Ambulatory Visit: Payer: Self-pay

## 2020-09-02 ENCOUNTER — Encounter: Payer: Self-pay | Admitting: Family

## 2020-09-02 VITALS — BP 134/80 | HR 67 | Temp 97.1°F | Ht 73.0 in | Wt 195.8 lb

## 2020-09-02 DIAGNOSIS — R300 Dysuria: Secondary | ICD-10-CM | POA: Diagnosis not present

## 2020-09-02 DIAGNOSIS — D696 Thrombocytopenia, unspecified: Secondary | ICD-10-CM

## 2020-09-02 DIAGNOSIS — R319 Hematuria, unspecified: Secondary | ICD-10-CM | POA: Diagnosis not present

## 2020-09-02 LAB — POCT URINALYSIS DIPSTICK
Bilirubin, UA: NEGATIVE
Glucose, UA: NEGATIVE
Ketones, UA: NEGATIVE
Nitrite, UA: NEGATIVE
Protein, UA: POSITIVE — AB
Spec Grav, UA: 1.01 (ref 1.010–1.025)
Urobilinogen, UA: 0.2 E.U./dL
pH, UA: 6 (ref 5.0–8.0)

## 2020-09-02 NOTE — Progress Notes (Signed)
Provider: Tranquilino Fischler FNP-C  Virgie Dad, MD  Patient Care Team: Virgie Dad, MD as PCP - General (Internal Medicine) Desma Maxim, MD as Referring Physician (Ophthalmology) Melida Quitter, MD as Consulting Physician (Otolaryngology)  Extended Emergency Contact Information Primary Emergency Contact: Doristine Bosworth Mobile Phone: [phone] Relation: Spouse  Code Status:  DNR Goals of care: Advanced Directive information Advanced Directives 07/02/2020  Does Patient Have a Medical Advance Directive? Yes  Type of Paramedic of [place];Living will  Does patient want to make changes to medical advance directive? No - Patient declined  Copy of [place] in Chart? Yes - validated most recent copy scanned in chart (See row information)  Pre-existing out of facility DNR order (yellow form or pink MOST form) -     Chief Complaint  Patient presents with   Acute Visit    Frequency, burning, and blood when urinating off/on x 1 week.     HPI:  Pt is a 87 y.o. male seen today for an acute visit for evaluation of frequent urination,burning and blood in the urine for 1 week.States blood in the urine is off and on. Saw small drops on the commode today.He denies nay fever,chills,abdominal,back pain,Nausea or vomiting. Previous Platelets 124; 140: 141  Reports no other signs of bleeding,melana or blood in the stool. Currently on Xarelto   Past Medical History:  Diagnosis Date   Cataract 2008   left and right eyde By Dr. Jodi Mourning    DVT (deep venous thrombosis) ([place])    Exudative age-related macular degeneration of right eye with active choroidal neovascularization ([place])    Hypercholesterolemia    Hypertension    Hypothyroid    Kidney stone    Vitreous detachment, bilateral 03/15/2019   Past Surgical History:  Procedure Laterality Date   CATARACT EXTRACTION W/ INTRAOCULAR LENS IMPLANT Bilateral    COLONOSCOPY     Dr. Alvia Grove,  prior polyps, but last due to age   HEMORROIDECTOMY  1984   Dr. Sherilyn Dacosta repair of right leg  Right    TOTAL HIP ARTHROPLASTY      Allergies  Allergen Reactions   Neosporin [Neomycin-Bacitracin Zn-Polymyx] Dermatitis   Penicillins     Outpatient Encounter Medications as of 09/02/2020  Medication Sig   atorvastatin (LIPITOR) 20 MG tablet TAKE ONE TABLET BY MOUTH EACH NIGHT AT BEDTIME   levothyroxine (SYNTHROID) 100 MCG tablet TAKE 1 TABLET BY MOUTH EVERY DAY BEFORE BREAKFAST   lisinopril (ZESTRIL) 10 MG tablet TAKE 1 TABLET BY MOUTH EVERY DAY   Multiple Vitamins-Minerals (PRESERVISION AREDS PO) Take by mouth 2 (two) times a day.    sildenafil (VIAGRA) 100 MG tablet Take 1 tablet (100 mg total) by mouth daily as needed for erectile dysfunction.   XARELTO 20 MG TABS tablet TAKE 1 TABLET BY MOUTH EVERY DAY WITH EVENING MEAL   No facility-administered encounter medications on file as of 09/02/2020.    Review of Systems  Constitutional:  Negative for appetite change, chills, fatigue, fever and unexpected weight change.  HENT:  Positive for hearing loss. Negative for nosebleeds, postnasal drip, rhinorrhea, sinus pressure, sinus pain, sneezing, sore throat and tinnitus.        Hearing aids in place   Respiratory:  Negative for cough, chest tightness, shortness of breath and wheezing.   Cardiovascular:  Negative for chest pain, palpitations and leg swelling.  Gastrointestinal:  Negative for abdominal distention, abdominal pain, blood in stool, constipation, diarrhea, nausea  and vomiting.  Genitourinary:  Positive for dysuria, frequency and hematuria. Negative for difficulty urinating, flank pain and urgency.  Musculoskeletal:  Positive for gait problem. Negative for arthralgias, back pain, joint swelling and myalgias.  Skin:  Negative for color change, pallor and rash.  Neurological:  Negative for dizziness, syncope, speech difficulty, weakness, light-headedness, numbness and  headaches.  Hematological:  Does not bruise/bleed easily.  Psychiatric/Behavioral:  Negative for agitation, behavioral problems, confusion, hallucinations and sleep disturbance. The patient is not nervous/anxious.    Immunization History  Administered Date(s) Administered   Fluad Quad(high Dose 65+) 12/18/2019   Influenza, High Dose Seasonal PF 11/28/2014, 12/12/2017, 10/08/2018   Influenza-Unspecified 12/08/2016   Moderna Sars-Covid-2 Vaccination 02/20/2019, 03/20/2019, 12/25/2019   Pneumococcal Conjugate-13 01/19/2017   Pneumococcal Polysaccharide-23 06/14/2018   Pertinent  Health Maintenance Due  Topic Date Due   INFLUENZA VACCINE  09/08/2020   PNA vac Low Risk Adult  Completed   Fall Risk  07/02/2020 01/14/2020 01/02/2020 01/02/2020 12/07/2019  Falls in the past year? 1 1 0 0 0  Number falls in past yr: 0 0 0 0 0  Injury with Fall? 1 1 0 0 0   Functional Status Survey:    Vitals:   09/02/20 1359  BP: 134/80  Pulse: 67  Temp: (!) 97.1 F (36.2 C)  TempSrc: Temporal  SpO2: 97%  Weight: 195 lb 12.8 oz (88.8 kg)  Height: '6\' 1"'$  (1.854 m)   Body mass index is 25.83 kg/m. Physical Exam Vitals reviewed.  Constitutional:      General: He is not in acute distress.    Appearance: Normal appearance. He is overweight. He is not ill-appearing or diaphoretic.  HENT:     Head: Normocephalic.     Ears:     Comments: Bilateral hearing aids in place     Nose: Nose normal. No congestion or rhinorrhea.     Mouth/Throat:     Mouth: Mucous membranes are moist.     Pharynx: Oropharynx is clear. No oropharyngeal exudate or posterior oropharyngeal erythema.  Eyes:     General: No scleral icterus.       Right eye: No discharge.        Left eye: No discharge.     Extraocular Movements: Extraocular movements intact.     Conjunctiva/sclera: Conjunctivae normal.     Pupils: Pupils are equal, round, and reactive to light.  Cardiovascular:     Rate and Rhythm: Normal rate and regular  rhythm.     Pulses: Normal pulses.     Heart sounds: Normal heart sounds. No murmur heard.   No friction rub. No gallop.  Pulmonary:     Effort: Pulmonary effort is normal. No respiratory distress.     Breath sounds: Normal breath sounds. No wheezing, rhonchi or rales.  Chest:     Chest wall: No tenderness.  Abdominal:     General: Bowel sounds are normal. There is no distension.     Palpations: Abdomen is soft. There is no mass.     Tenderness: There is no abdominal tenderness. There is no right CVA tenderness, left CVA tenderness, guarding or rebound.  Musculoskeletal:        General: No swelling or tenderness. Normal range of motion.     Right lower leg: No edema.     Left lower leg: No edema.  Skin:    General: Skin is warm and dry.     Coloration: Skin is not pale.     Findings: No  bruising, erythema, lesion or rash.  Neurological:     Mental Status: He is alert and oriented to person, place, and time.     Cranial Nerves: No cranial nerve deficit.     Motor: No weakness.     Gait: Gait abnormal.  Psychiatric:        Mood and Affect: Mood normal.        Speech: Speech normal.        Behavior: Behavior normal.        Thought Content: Thought content normal.        Judgment: Judgment normal.    Labs reviewed: Recent Labs    01/08/20 0000 07/08/20 0000  NA 139 141  K 4.6 4.2  CL 107 104  CO2 22 22  BUN 30* 32*  CREATININE 2.2* 2.6*  CALCIUM 8.9 9.1   Recent Labs    07/08/20 0000  AST 22  ALT 16  ALKPHOS 123  ALBUMIN 4.6   Recent Labs    07/08/20 0000  WBC 3.9  HGB 15.4  HCT 45  PLT 124*   Lab Results  Component Value Date   TSH 2.57 07/08/2020   No results found for: HGBA1C Lab Results  Component Value Date   CHOL 200 07/08/2020   HDL 49 07/08/2020   LDLCALC 126 07/08/2020   TRIG 125 07/08/2020    Significant Diagnostic Results in last 30 days:  No results found.  Assessment/Plan  1. Hematuria, unspecified type Reports small drops of  blood on commode. - POC Urinalysis Dipstick indicates yellow clear urine with trace leukocytes and large blood but negative for Nitrites. Asymptomatic.made aware will send urine for culture then will call with results in 2-3 days. - Advised to notify provider if symptoms worse or running any fever or chills - Has had previous low platelets will recheck today  - Culture, Urine - CBC with Differential/Platelet  2. Dysuria Afebrile. - POC Urinalysis Dipstick yellow clear urine with trace leukocytes and large blood but negative for Nitrites.will send for culture agrees to wait for final urine culture. Will notify provider if symptoms worsen.  - Culture, Urine  3. Thrombocytopenia ([place]) On chart review previous level was 124 ; 140; 141  No petechiae noted on exam. - CBC with Differential/Platelet - advised to notify provider for any signs of bleeding  Family/ staff Communication: Reviewed plan of care with patient verbalized understanding   Labs/tests ordered:  - POC Urinalysis Dipstick  - Culture, Urine - CBC with Differential/Platelet  Next Appointment: As needed if symptoms worsen or fail to improve    Sandrea Hughs, NP

## 2020-09-02 NOTE — Patient Instructions (Addendum)
Increase water intake to 6-8 glasses daily   - Notify provider if running any fever,chills,or symptoms worsen   - Urine culture send to lab will call you in 2-3 days   - CBC done today to evaluate platelets level previous level was low.Notify provider for any signs of bleeding   Urinary Tract Infection, Adult  A urinary tract infection (UTI) is an infection of any part of the urinary tract. The urinary tract includes the kidneys, ureters, bladder, and urethra.These organs make, store, and get rid of urine in the body. An upper UTI affects the ureters and kidneys. A lower UTI affects the bladderand urethra. What are the causes? Most urinary tract infections are caused by bacteria in your genital area around your urethra, where urine leaves your body. These bacteria grow andcause inflammation of your urinary tract. What increases the risk? You are more likely to develop this condition if: You have a urinary catheter that stays in place. You are not able to control when you urinate or have a bowel movement (incontinence). You are male and you: Use a spermicide or diaphragm for birth control. Have low estrogen levels. Are pregnant. You have certain genes that increase your risk. You are sexually active. You take antibiotic medicines. You have a condition that causes your flow of urine to slow down, such as: An enlarged prostate, if you are male. Blockage in your urethra. A kidney stone. A nerve condition that affects your bladder control (neurogenic bladder). Not getting enough to drink, or not urinating often. You have certain medical conditions, such as: Diabetes. A weak disease-fighting system (immunesystem). Sickle cell disease. Gout. Spinal cord injury. What are the signs or symptoms? Symptoms of this condition include: Needing to urinate right away (urgency). Frequent urination. This may include small amounts of urine each time you urinate. Pain or burning with  urination. Blood in the urine. Urine that smells bad or unusual. Trouble urinating. Cloudy urine. Vaginal discharge, if you are male. Pain in the abdomen or the lower back. You may also have: Vomiting or a decreased appetite. Confusion. Irritability or tiredness. A fever or chills. Diarrhea. The first symptom in older adults may be confusion. In some cases, they may nothave any symptoms until the infection has worsened. How is this diagnosed? This condition is diagnosed based on your medical history and a physical exam. You may also have other tests, including: Urine tests. Blood tests. Tests for STIs (sexually transmitted infections). If you have had more than one UTI, a cystoscopy or imaging studies may be doneto determine the cause of the infections. How is this treated? Treatment for this condition includes: Antibiotic medicine. Over-the-counter medicines to treat discomfort. Drinking enough water to stay hydrated. If you have frequent infections or have other conditions such as a kidney stone, you may need to see a health care provider who specializes in the urinary tract (urologist). In rare cases, urinary tract infections can cause sepsis. Sepsis is a life-threatening condition that occurs when the body responds to an infection. Sepsis is treated in the hospital with IV antibiotics, fluids, and othermedicines. Follow these instructions at home:  Medicines Take over-the-counter and prescription medicines only as told by your health care provider. If you were prescribed an antibiotic medicine, take it as told by your health care provider. Do not stop using the antibiotic even if you start to feel better. General instructions Make sure you: Empty your bladder often and completely. Do not hold urine for long periods of time. Empty  your bladder after sex. Wipe from front to back after urinating or having a bowel movement if you are male. Use each tissue only one time when  you wipe. Drink enough fluid to keep your urine pale yellow. Keep all follow-up visits. This is important. Contact a health care provider if: Your symptoms do not get better after 1-2 days. Your symptoms go away and then return. Get help right away if: You have severe pain in your back or your lower abdomen. You have a fever or chills. You have nausea or vomiting. Summary A urinary tract infection (UTI) is an infection of any part of the urinary tract, which includes the kidneys, ureters, bladder, and urethra. Most urinary tract infections are caused by bacteria in your genital area. Treatment for this condition often includes antibiotic medicines. If you were prescribed an antibiotic medicine, take it as told by your health care provider. Do not stop using the antibiotic even if you start to feel better. Keep all follow-up visits. This is important. This information is not intended to replace advice given to you by your health care provider. Make sure you discuss any questions you have with your healthcare provider. Document Revised: 09/07/2019 Document Reviewed: 09/07/2019 Elsevier Patient Education  [place].

## 2020-09-04 LAB — URINE CULTURE
MICRO NUMBER:: 12167908
Result:: NO GROWTH
SPECIMEN QUALITY:: ADEQUATE

## 2020-09-04 LAB — CBC WITH DIFFERENTIAL/PLATELET
Absolute Monocytes: 345 cells/uL (ref 200–950)
Basophils Absolute: 20 cells/uL (ref 0–200)
Basophils Relative: 0.4 %
Eosinophils Absolute: 40 cells/uL (ref 15–500)
Eosinophils Relative: 0.8 %
HCT: 42.2 % (ref 38.5–50.0)
Hemoglobin: 14 g/dL (ref 13.2–17.1)
Lymphs Abs: 795 cells/uL — ABNORMAL LOW (ref 850–3900)
MCH: 32.1 pg (ref 27.0–33.0)
MCHC: 33.2 g/dL (ref 32.0–36.0)
MCV: 96.8 fL (ref 80.0–100.0)
MPV: 10.6 fL (ref 7.5–12.5)
Monocytes Relative: 6.9 %
Neutro Abs: 3800 cells/uL (ref 1500–7800)
Neutrophils Relative %: 76 %
Platelets: 146 10*3/uL (ref 140–400)
RBC: 4.36 10*6/uL (ref 4.20–5.80)
RDW: 12.7 % (ref 11.0–15.0)
Total Lymphocyte: 15.9 %
WBC: 5 10*3/uL (ref 3.8–10.8)

## 2020-09-25 DIAGNOSIS — H353124 Nonexudative age-related macular degeneration, left eye, advanced atrophic with subfoveal involvement: Secondary | ICD-10-CM | POA: Diagnosis not present

## 2020-09-25 DIAGNOSIS — H353211 Exudative age-related macular degeneration, right eye, with active choroidal neovascularization: Secondary | ICD-10-CM | POA: Diagnosis not present

## 2020-10-16 ENCOUNTER — Other Ambulatory Visit: Payer: Self-pay | Admitting: *Deleted

## 2020-10-16 MED ORDER — LISINOPRIL 10 MG PO TABS
10.0000 mg | ORAL_TABLET | Freq: Every day | ORAL | 1 refills | Status: DC
Start: 2020-10-16 — End: 2021-04-16

## 2020-10-16 NOTE — Telephone Encounter (Signed)
Pharmacy Requested refill.  

## 2020-10-21 DIAGNOSIS — H6123 Impacted cerumen, bilateral: Secondary | ICD-10-CM | POA: Diagnosis not present

## 2020-11-06 DIAGNOSIS — H353211 Exudative age-related macular degeneration, right eye, with active choroidal neovascularization: Secondary | ICD-10-CM | POA: Diagnosis not present

## 2020-11-06 DIAGNOSIS — H43813 Vitreous degeneration, bilateral: Secondary | ICD-10-CM | POA: Diagnosis not present

## 2020-11-06 DIAGNOSIS — H35371 Puckering of macula, right eye: Secondary | ICD-10-CM | POA: Diagnosis not present

## 2020-11-06 DIAGNOSIS — H353124 Nonexudative age-related macular degeneration, left eye, advanced atrophic with subfoveal involvement: Secondary | ICD-10-CM | POA: Diagnosis not present

## 2020-11-13 ENCOUNTER — Other Ambulatory Visit: Payer: Self-pay | Admitting: Adult Health

## 2020-11-13 ENCOUNTER — Other Ambulatory Visit: Payer: Self-pay | Admitting: Internal Medicine

## 2020-11-13 MED ORDER — RIVAROXABAN 20 MG PO TABS: ORAL_TABLET | ORAL | 5 refills | Status: DC

## 2020-11-18 DIAGNOSIS — Z85828 Personal history of other malignant neoplasm of skin: Secondary | ICD-10-CM | POA: Diagnosis not present

## 2020-11-18 DIAGNOSIS — L821 Other seborrheic keratosis: Secondary | ICD-10-CM | POA: Diagnosis not present

## 2020-11-18 DIAGNOSIS — D485 Neoplasm of uncertain behavior of skin: Secondary | ICD-10-CM | POA: Diagnosis not present

## 2020-11-18 DIAGNOSIS — Z08 Encounter for follow-up examination after completed treatment for malignant neoplasm: Secondary | ICD-10-CM | POA: Diagnosis not present

## 2020-11-18 DIAGNOSIS — L304 Erythema intertrigo: Secondary | ICD-10-CM | POA: Diagnosis not present

## 2020-11-18 DIAGNOSIS — Z85820 Personal history of malignant melanoma of skin: Secondary | ICD-10-CM | POA: Diagnosis not present

## 2020-11-18 DIAGNOSIS — L578 Other skin changes due to chronic exposure to nonionizing radiation: Secondary | ICD-10-CM | POA: Diagnosis not present

## 2020-11-18 DIAGNOSIS — C44619 Basal cell carcinoma of skin of left upper limb, including shoulder: Secondary | ICD-10-CM | POA: Diagnosis not present

## 2020-11-18 DIAGNOSIS — R238 Other skin changes: Secondary | ICD-10-CM | POA: Diagnosis not present

## 2020-11-18 DIAGNOSIS — C44529 Squamous cell carcinoma of skin of other part of trunk: Secondary | ICD-10-CM | POA: Diagnosis not present

## 2020-12-02 ENCOUNTER — Other Ambulatory Visit: Payer: Self-pay | Admitting: Internal Medicine

## 2020-12-08 ENCOUNTER — Other Ambulatory Visit: Payer: Self-pay

## 2020-12-08 ENCOUNTER — Encounter: Payer: Medicare PPO | Admitting: Family

## 2020-12-10 DIAGNOSIS — H903 Sensorineural hearing loss, bilateral: Secondary | ICD-10-CM | POA: Diagnosis not present

## 2020-12-11 DIAGNOSIS — H353124 Nonexudative age-related macular degeneration, left eye, advanced atrophic with subfoveal involvement: Secondary | ICD-10-CM | POA: Diagnosis not present

## 2020-12-11 DIAGNOSIS — H353211 Exudative age-related macular degeneration, right eye, with active choroidal neovascularization: Secondary | ICD-10-CM | POA: Diagnosis not present

## 2020-12-11 NOTE — Progress Notes (Signed)
This encounter was created in error - please disregard.

## 2020-12-22 NOTE — Progress Notes (Signed)
Location:  [facility]  POS: clinic  Provider:  Cindi Carbon, [place] [phone]  Code Status:  Goals of Care:  Advanced Directives 12/29/2020  Does Patient Have a Medical Advance Directive? Yes  Type of Advance Directive Living will  Does patient want to make changes to medical advance directive? No - Patient declined  Copy of [place] in Chart? -  Pre-existing out of facility DNR order (yellow form or pink MOST form) -     Chief Complaint  Patient presents with   Medical Management of Chronic Issues    Patient returns to the clinic for follow up. Patient states he is recovering from Bronchitis well.    Quality Metric Gaps    Patient aware due for TDAP, Shingrix, and Flu shot.     HPI: Patient is a 87 y.o. male seen today for medical management of chronic diseases.   Seen in the ED on 11/16 and diagnosed with bronchitis and treated with prednisone and doxycycline. He has not needed albuterol and feels better.   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 NO falls since last year. Lives with his wife. Retired Forensic psychologist.   No urinary issues, gets up one time per night.   Has constipation sometimes and uses miralax  Sleeping well. No issues with depression or anxiety.   Has macular degeneration and gets eye injections. Vision stable per his account the past 3-4 years.      Past Medical History:  Diagnosis Date   Cataract 2008   left and right eyde By Dr. Jodi Mourning    DVT (deep venous thrombosis) ([place])    Exudative age-related macular degeneration of right eye with active choroidal neovascularization ([place])    Hypercholesterolemia    Hypertension    Hypothyroid    Kidney stone    Vitreous detachment, bilateral 03/15/2019    Past Surgical History:  Procedure Laterality Date   CATARACT EXTRACTION W/ INTRAOCULAR LENS  IMPLANT Bilateral    COLONOSCOPY     Dr. Alvia Grove, prior polyps, but last due to age   HEMORROIDECTOMY  1984   Dr. Sherilyn Dacosta repair of right leg  Right    TOTAL HIP ARTHROPLASTY      Allergies  Allergen Reactions   Neosporin [Neomycin-Bacitracin Zn-Polymyx] Dermatitis   Penicillins     Outpatient Encounter Medications as of 12/29/2020  Medication Sig   albuterol (VENTOLIN HFA) 108 (90 Base) MCG/ACT inhaler Inhale 1-2 puffs into the lungs every 6 (six) hours as needed for wheezing or shortness of breath.   atorvastatin (LIPITOR) 20 MG tablet TAKE ONE TABLET BY MOUTH EACH NIGHT AT BEDTIME   doxycycline (VIBRAMYCIN) 100 MG capsule Take 1 capsule (100 mg total) by mouth 2 (two) times daily.   levothyroxine (SYNTHROID) 100 MCG tablet TAKE 1 TABLET BY MOUTH EVERY DAY BEFORE BREAKFAST   lisinopril (ZESTRIL) 10 MG tablet Take 1 tablet (10 mg total) by mouth daily.   Multiple Vitamins-Minerals (PRESERVISION AREDS PO) Take by mouth 2 (two) times a day.    predniSONE (STERAPRED UNI-PAK 21 TAB) 10 MG (21) TBPK tablet Take by mouth daily. Take 6 tabs by mouth daily  for 2 days, then 5 tabs for 2 days, then 4 tabs for 2 days, then 3 tabs for 2 days, 2 tabs for 2 days, then 1 tab by mouth daily for 2  days   rivaroxaban (XARELTO) 20 MG TABS tablet TAKE 1 TABLET BY MOUTH EVERY DAY WITH EVENING MEAL   sildenafil (VIAGRA) 100 MG tablet Take 1 tablet (100 mg total) by mouth daily as needed for erectile dysfunction.   [DISCONTINUED] levothyroxine (SYNTHROID) 100 MCG tablet TAKE 1 TABLET BY MOUTH EVERY DAY BEFORE BREAKFAST   No facility-administered encounter medications on file as of 12/29/2020.    Review of Systems:  Review of Systems  Constitutional:  Negative for activity change, appetite change, chills, diaphoresis, fatigue, fever and unexpected weight change.  Eyes:        Macular degeneration   Respiratory:  Negative for cough, shortness of breath, wheezing (improved) and stridor.    Cardiovascular:  Negative for chest pain, palpitations and leg swelling.  Gastrointestinal:  Positive for constipation. Negative for abdominal distention, abdominal pain and diarrhea.  Genitourinary:  Negative for difficulty urinating and dysuria.  Musculoskeletal:  Negative for arthralgias, back pain, gait problem, joint swelling and myalgias.  Neurological:  Negative for dizziness, seizures, syncope, facial asymmetry, speech difficulty, weakness and headaches.  Hematological:  Negative for adenopathy. Does not bruise/bleed easily.  Psychiatric/Behavioral:  Negative for agitation, behavioral problems and confusion.    Health Maintenance  Topic Date Due   TETANUS/TDAP  Never done   Zoster Vaccines- Shingrix (1 of 2) Never done   INFLUENZA VACCINE  09/08/2020   COVID-19 Vaccine (4 - Booster for Moderna series) 01/16/2021   Pneumonia Vaccine 22+ Years old  Completed   HPV VACCINES  Aged Out    Physical Exam: Vitals:   12/29/20 1402  BP: (!) 144/78  Pulse: (!) 53  Temp: (!) 96.2 F (35.7 C)  SpO2: 100%  Weight: 192 lb 12.8 oz (87.5 kg)  Height: 6\' 5"  (1.956 m)   Body mass index is 22.86 kg/m. Physical Exam Constitutional:      General: He is not in acute distress.    Appearance: He is not diaphoretic.  HENT:     Head: Normocephalic and atraumatic.     Right Ear: Tympanic membrane normal.     Left Ear: Tympanic membrane normal.     Nose: Nose normal. No congestion.     Mouth/Throat:     Mouth: Mucous membranes are moist.     Pharynx: Oropharynx is clear.  Eyes:     Conjunctiva/sclera: Conjunctivae normal.     Pupils: Pupils are equal, round, and reactive to light.  Neck:     Thyroid: No thyromegaly.     Vascular: No JVD.     Trachea: No tracheal deviation.  Cardiovascular:     Rate and Rhythm: Normal rate and regular rhythm.     Heart sounds: No murmur heard. Pulmonary:     Effort: Pulmonary effort is normal. No respiratory distress.     Breath sounds: Wheezing  present.  Abdominal:     General: Bowel sounds are normal. There is no distension.     Palpations: Abdomen is soft.     Tenderness: There is no abdominal tenderness.  Musculoskeletal:        General: No swelling, tenderness, deformity or signs of injury.     Right lower leg: No edema.     Left lower leg: No edema.  Lymphadenopathy:     Cervical: No cervical adenopathy.  Skin:    General: Skin is warm and dry.  Neurological:     Mental Status: He is alert and oriented to person, place, and time.     Cranial Nerves:  No cranial nerve deficit.    Labs reviewed: Basic Metabolic Panel: Recent Labs    01/08/20 0000 07/08/20 0000  NA 139 141  K 4.6 4.2  CL 107 104  CO2 22 22  BUN 30* 32*  CREATININE 2.2* 2.6*  CALCIUM 8.9 9.1  TSH  --  2.57   Liver Function Tests: Recent Labs    07/08/20 0000  AST 22  ALT 16  ALKPHOS 123  ALBUMIN 4.6   No results for input(s): LIPASE, AMYLASE in the last 8760 hours. No results for input(s): AMMONIA in the last 8760 hours. CBC: Recent Labs    07/08/20 0000 09/02/20 1417  WBC 3.9 5.0  NEUTROABS  --  3,800  HGB 15.4 14.0  HCT 45 42.2  MCV  --  96.8  PLT 124* 146   Lipid Panel: Recent Labs    07/08/20 0000  CHOL 200  HDL 49  LDLCALC 126  TRIG 125   No results found for: HGBA1C  Procedures since last visit: DG Chest 2 View  Result Date: 12/24/2020 CLINICAL DATA:  Productive cough over the last several days. EXAM: CHEST - 2 VIEW COMPARISON:  01/15/2020 FINDINGS: Heart size is normal. Atherosclerotic change of the aorta. Central bronchial thickening suggesting bronchitis. No infiltrate, collapse or effusion. No significant bone finding. IMPRESSION: Possible bronchitis.  No consolidation or collapse. Electronically Signed   By: Nelson Chimes M.D.   On: 12/24/2020 12:00    Assessment/Plan  1. Bronchitis Improved Continue prednisone and doxycycline   2. Essential hypertension Controlled Continue lipitor   3. DVT, lower  extremity, recurrent, left ([place]) With IVC filter Continues on xarelto without s/e  4. Slow transit constipation Controlled with as needed miralax  5. Acquired hypothyroidism Lab Results  Component Value Date   TSH 2.57 07/08/2020   Continue Synthroid and check TSH at next visit.   6. Presbycusis of both ears Has hearing aides.   7. Macular degeneration, unspecified laterality, unspecified type Followed by ophthalmology and receives in jections  8. Pure hypercholesterolemia Lab Results  Component Value Date   LDLCALC 126 07/08/2020   Continues on lipitor 20 mg  Recommend flu, tdap and shingrix vaccine.  Labs/tests ordered:  * No order type specified * CBC lipid TSH BMP prior to next apt Next appt: F/U 6 months  Total time 75min:  time greater than 50% of total time spent doing pt counseling and coordination of care

## 2020-12-24 ENCOUNTER — Emergency Department (HOSPITAL_BASED_OUTPATIENT_CLINIC_OR_DEPARTMENT_OTHER)
Admission: EM | Admit: 2020-12-24 | Discharge: 2020-12-24 | Disposition: A | Discharge status: Home / Self Care | Payer: Medicare PPO | Attending: Emergency Medicine | Admitting: Emergency Medicine

## 2020-12-24 ENCOUNTER — Encounter (HOSPITAL_BASED_OUTPATIENT_CLINIC_OR_DEPARTMENT_OTHER): Payer: Self-pay | Admitting: Emergency Medicine

## 2020-12-24 ENCOUNTER — Telehealth: Payer: Self-pay | Admitting: *Deleted

## 2020-12-24 ENCOUNTER — Emergency Department (HOSPITAL_BASED_OUTPATIENT_CLINIC_OR_DEPARTMENT_OTHER): Payer: Medicare PPO | Admitting: Radiology

## 2020-12-24 ENCOUNTER — Other Ambulatory Visit: Payer: Self-pay

## 2020-12-24 DIAGNOSIS — R0981 Nasal congestion: Secondary | ICD-10-CM | POA: Insufficient documentation

## 2020-12-24 DIAGNOSIS — J209 Acute bronchitis, unspecified: Secondary | ICD-10-CM | POA: Diagnosis not present

## 2020-12-24 DIAGNOSIS — Z79899 Other long term (current) drug therapy: Secondary | ICD-10-CM | POA: Insufficient documentation

## 2020-12-24 DIAGNOSIS — I10 Essential (primary) hypertension: Secondary | ICD-10-CM | POA: Insufficient documentation

## 2020-12-24 DIAGNOSIS — Z20822 Contact with and (suspected) exposure to covid-19: Secondary | ICD-10-CM | POA: Insufficient documentation

## 2020-12-24 DIAGNOSIS — Z96641 Presence of right artificial hip joint: Secondary | ICD-10-CM | POA: Diagnosis not present

## 2020-12-24 DIAGNOSIS — E039 Hypothyroidism, unspecified: Secondary | ICD-10-CM | POA: Insufficient documentation

## 2020-12-24 DIAGNOSIS — Z87891 Personal history of nicotine dependence: Secondary | ICD-10-CM | POA: Diagnosis not present

## 2020-12-24 DIAGNOSIS — R059 Cough, unspecified: Secondary | ICD-10-CM | POA: Diagnosis not present

## 2020-12-24 DIAGNOSIS — J40 Bronchitis, not specified as acute or chronic: Secondary | ICD-10-CM

## 2020-12-24 LAB — RESP PANEL BY RT-PCR (FLU A&B, COVID) ARPGX2
Influenza A by PCR: NEGATIVE
Influenza B by PCR: NEGATIVE
SARS Coronavirus 2 by RT PCR: NEGATIVE

## 2020-12-24 MED ORDER — PREDNISONE 10 MG (21) PO TBPK: ORAL_TABLET | Freq: Every day | ORAL | 0 refills | Status: DC

## 2020-12-24 MED ORDER — DOXYCYCLINE HYCLATE 100 MG PO CAPS: 100.0000 mg | ORAL_CAPSULE | Freq: Two times a day (BID) | ORAL | 0 refills | Status: DC

## 2020-12-24 MED ORDER — ALBUTEROL SULFATE HFA 108 (90 BASE) MCG/ACT IN AERS
1.0000 | INHALATION_SPRAY | Freq: Four times a day (QID) | RESPIRATORY_TRACT | 0 refills | Status: DC | PRN
Start: 2020-12-24 — End: 2021-07-01

## 2020-12-24 NOTE — ED Notes (Signed)
Lung Sounds Clear, S1 S2 heard

## 2020-12-24 NOTE — Telephone Encounter (Signed)
Heather with [facility] called and stated that she has patient in her office complaining of cough for several days.  Right Lower Lung Extory Wheezing and Left Lung Diminished.   RR 32 Pulse 82  Requesting patient to be seen. No available appointment so I offered to send message to the Provider and Nira Conn stated Never Mind I will send them a "Tiger Text" and hung up.

## 2020-12-24 NOTE — Telephone Encounter (Signed)
I spoke with Stanley Brooks and the patient had increased respirations and pain on inspiration and so we sent them to the ER.

## 2020-12-24 NOTE — ED Provider Notes (Signed)
[facility] EMERGENCY DEPT Provider Note   CSN: [id number] Arrival date & time: 12/24/20  1107     History Chief Complaint  Patient presents with   Cough    Stanley Brooks is a 87 y.o. male.  87 year old male presents with 2 days of cough and congestion.  Has had a scratchy throat.  Denies any fever.  No vomiting or diarrhea.  States he otherwise feels okay.  Denies any respiratory symptoms.  Neuropsych the facility where he stays at was concerned about how he sounded in his lungs and requested patient have an x-ray.      Past Medical History:  Diagnosis Date   Cataract 2008   left and right eyde By Dr. Jodi Mourning    DVT (deep venous thrombosis) ([place])    Exudative age-related macular degeneration of right eye with active choroidal neovascularization ([place])    Hypercholesterolemia    Hypertension    Hypothyroid    Kidney stone    Vitreous detachment, bilateral 03/15/2019    Patient Active Problem List   Diagnosis Date Noted   Macular degeneration 01/02/2020   Balance problem 01/02/2020   Slow transit constipation 01/03/2019   Pure hypercholesterolemia 01/03/2019   Erectile dysfunction 12/12/2017   Essential hypertension 10/06/2016   Hyperlipidemia 10/06/2016   Presbycusis of both ears 10/06/2016   Acquired hypothyroidism 10/06/2016   DVT, lower extremity, recurrent, left ([facility])     Past Surgical History:  Procedure Laterality Date   CATARACT EXTRACTION W/ INTRAOCULAR LENS IMPLANT Bilateral    COLONOSCOPY     Dr. Alvia Grove, prior polyps, but last due to age   HEMORROIDECTOMY  78   Dr. Sherilyn Dacosta repair of right leg  Right    TOTAL HIP ARTHROPLASTY         Family History  Problem Relation Age of Onset   Aortic aneurysm Father     Social History   Tobacco Use   Smoking status: Former    Years: 23.00    Types: Cigarettes    Quit date: 02/09/1968    Years since quitting: 52.9   Smokeless tobacco: Never  Vaping Use   Vaping Use: Never  used  Substance Use Topics   Alcohol use: Yes    Alcohol/week: 7.0 standard drinks    Types: 7 Shots of liquor per week   Drug use: No    Home Medications Prior to Admission medications   Medication Sig Start Date End Date Taking? Authorizing Provider  atorvastatin (LIPITOR) 20 MG tablet TAKE ONE TABLET BY MOUTH EACH NIGHT AT BEDTIME 12/02/20   Virgie Dad, MD  levothyroxine (SYNTHROID) 100 MCG tablet TAKE 1 TABLET BY MOUTH EVERY DAY BEFORE BREAKFAST 07/02/20   Virgie Dad, MD  lisinopril (ZESTRIL) 10 MG tablet Take 1 tablet (10 mg total) by mouth daily. 10/16/20   Virgie Dad, MD  Multiple Vitamins-Minerals (PRESERVISION AREDS PO) Take by mouth 2 (two) times a day.     [provider]  rivaroxaban (XARELTO) 20 MG TABS tablet TAKE 1 TABLET BY MOUTH EVERY DAY WITH EVENING MEAL 11/13/20   Royal Hawthorn, NP  sildenafil (VIAGRA) 100 MG tablet Take 1 tablet (100 mg total) by mouth daily as needed for erectile dysfunction. 12/12/17   Reed, Tiffany L, DO    Allergies    Neosporin [neomycin-bacitracin zn-polymyx] and Penicillins  Review of Systems   Review of Systems  All other systems reviewed and are negative.  Physical Exam Updated Vital Signs BP Marland Kitchen)  145/75 (BP Location: Right Arm)   Pulse 80   Temp 97.7 F (36.5 C) (Oral)   Resp 19   Ht 1.956 m (6\' 5" )   Wt 90.7 kg   SpO2 99%   BMI 23.72 kg/m   Physical Exam Vitals and nursing note reviewed.  Constitutional:      General: He is not in acute distress.    Appearance: Normal appearance. He is well-developed. He is not toxic-appearing.  HENT:     Head: Normocephalic and atraumatic.  Eyes:     General: Lids are normal.     Conjunctiva/sclera: Conjunctivae normal.     Pupils: Pupils are equal, round, and reactive to light.  Neck:     Thyroid: No thyroid mass.     Trachea: No tracheal deviation.  Cardiovascular:     Rate and Rhythm: Normal rate and regular rhythm.     Heart sounds: Normal heart sounds. No  murmur heard.   No gallop.  Pulmonary:     Effort: Pulmonary effort is normal. No respiratory distress.     Breath sounds: No stridor. Examination of the left-lower field reveals decreased breath sounds. Decreased breath sounds present. No wheezing, rhonchi or rales.  Abdominal:     General: There is no distension.     Palpations: Abdomen is soft.     Tenderness: There is no abdominal tenderness. There is no rebound.  Musculoskeletal:        General: No tenderness. Normal range of motion.     Cervical back: Normal range of motion and neck supple.  Skin:    General: Skin is warm and dry.     Findings: No abrasion or rash.  Neurological:     Mental Status: He is alert and oriented to person, place, and time. Mental status is at baseline.     GCS: GCS eye subscore is 4. GCS verbal subscore is 5. GCS motor subscore is 6.     Cranial Nerves: No cranial nerve deficit.     Sensory: No sensory deficit.     Motor: Motor function is intact.  Psychiatric:        Attention and Perception: Attention normal.        Speech: Speech normal.        Behavior: Behavior normal.    ED Results / Procedures / Treatments   Labs (all labs ordered are listed, but only abnormal results are displayed) Labs Reviewed  RESP PANEL BY RT-PCR (FLU A&B, COVID) [id number]    EKG None  Radiology No results found.  Procedures Procedures   Medications Ordered in ED Medications - No data to display  ED Course  I have reviewed the triage vital signs and the nursing notes.  Pertinent labs & imaging results that were available during my care of the patient were reviewed by me and considered in my medical decision making (see chart for details).    MDM Rules/Calculators/A&P                           Patient with evidence of bronchitis on chest x-ray.  COVID and flu test are negative.  Will place on antibiotics and steroids and discharge Final Clinical Impression(s) / ED Diagnoses Final diagnoses:  Cough     Rx / DC Orders ED Discharge Orders     None        Lacretia Leigh, MD 12/24/20 1236

## 2020-12-24 NOTE — ED Triage Notes (Signed)
Pt complains of productive. cough for the past few days. Pt stated that the RN at [facility] recommended that he get a chest x ray.

## 2020-12-26 ENCOUNTER — Other Ambulatory Visit: Payer: Self-pay | Admitting: Internal Medicine

## 2020-12-29 ENCOUNTER — Other Ambulatory Visit: Payer: Self-pay

## 2020-12-29 ENCOUNTER — Non-Acute Institutional Stay: Payer: Medicare PPO | Admitting: Adult Health

## 2020-12-29 ENCOUNTER — Encounter: Payer: Self-pay | Admitting: Adult Health

## 2020-12-29 VITALS — BP 144/78 | HR 53 | Temp 96.2°F | Ht 77.0 in | Wt 192.8 lb

## 2020-12-29 DIAGNOSIS — I10 Essential (primary) hypertension: Secondary | ICD-10-CM

## 2020-12-29 DIAGNOSIS — I82402 Acute embolism and thrombosis of unspecified deep veins of left lower extremity: Secondary | ICD-10-CM

## 2020-12-29 DIAGNOSIS — H9113 Presbycusis, bilateral: Secondary | ICD-10-CM

## 2020-12-29 DIAGNOSIS — J40 Bronchitis, not specified as acute or chronic: Secondary | ICD-10-CM

## 2020-12-29 DIAGNOSIS — E7800 Pure hypercholesterolemia, unspecified: Secondary | ICD-10-CM

## 2020-12-29 DIAGNOSIS — K5901 Slow transit constipation: Secondary | ICD-10-CM | POA: Diagnosis not present

## 2020-12-29 DIAGNOSIS — H3530 Unspecified macular degeneration: Secondary | ICD-10-CM

## 2020-12-29 DIAGNOSIS — E039 Hypothyroidism, unspecified: Secondary | ICD-10-CM

## 2020-12-29 NOTE — Patient Instructions (Signed)
Recommend flu shot right away  Also recommend shingrix and tdap vaccine.

## 2020-12-31 DIAGNOSIS — C44529 Squamous cell carcinoma of skin of other part of trunk: Secondary | ICD-10-CM | POA: Diagnosis not present

## 2020-12-31 DIAGNOSIS — L905 Scar conditions and fibrosis of skin: Secondary | ICD-10-CM | POA: Diagnosis not present

## 2021-01-22 DIAGNOSIS — H353211 Exudative age-related macular degeneration, right eye, with active choroidal neovascularization: Secondary | ICD-10-CM | POA: Diagnosis not present

## 2021-01-22 DIAGNOSIS — H43813 Vitreous degeneration, bilateral: Secondary | ICD-10-CM | POA: Diagnosis not present

## 2021-01-22 DIAGNOSIS — H43393 Other vitreous opacities, bilateral: Secondary | ICD-10-CM | POA: Diagnosis not present

## 2021-01-22 DIAGNOSIS — H353124 Nonexudative age-related macular degeneration, left eye, advanced atrophic with subfoveal involvement: Secondary | ICD-10-CM | POA: Diagnosis not present

## 2021-02-24 DIAGNOSIS — N529 Male erectile dysfunction, unspecified: Secondary | ICD-10-CM | POA: Diagnosis not present

## 2021-02-24 DIAGNOSIS — I10 Essential (primary) hypertension: Secondary | ICD-10-CM | POA: Diagnosis not present

## 2021-02-24 DIAGNOSIS — E785 Hyperlipidemia, unspecified: Secondary | ICD-10-CM | POA: Diagnosis not present

## 2021-02-24 DIAGNOSIS — Z85828 Personal history of other malignant neoplasm of skin: Secondary | ICD-10-CM | POA: Diagnosis not present

## 2021-02-24 DIAGNOSIS — E039 Hypothyroidism, unspecified: Secondary | ICD-10-CM | POA: Diagnosis not present

## 2021-02-24 DIAGNOSIS — R32 Unspecified urinary incontinence: Secondary | ICD-10-CM | POA: Diagnosis not present

## 2021-02-24 DIAGNOSIS — Z87891 Personal history of nicotine dependence: Secondary | ICD-10-CM | POA: Diagnosis not present

## 2021-02-24 DIAGNOSIS — Z7901 Long term (current) use of anticoagulants: Secondary | ICD-10-CM | POA: Diagnosis not present

## 2021-02-24 DIAGNOSIS — Z86718 Personal history of other venous thrombosis and embolism: Secondary | ICD-10-CM | POA: Diagnosis not present

## 2021-03-05 DIAGNOSIS — H35371 Puckering of macula, right eye: Secondary | ICD-10-CM | POA: Diagnosis not present

## 2021-03-05 DIAGNOSIS — H353211 Exudative age-related macular degeneration, right eye, with active choroidal neovascularization: Secondary | ICD-10-CM | POA: Diagnosis not present

## 2021-03-05 DIAGNOSIS — H353124 Nonexudative age-related macular degeneration, left eye, advanced atrophic with subfoveal involvement: Secondary | ICD-10-CM | POA: Diagnosis not present

## 2021-04-16 ENCOUNTER — Other Ambulatory Visit: Payer: Self-pay | Admitting: Internal Medicine

## 2021-04-20 DIAGNOSIS — H353124 Nonexudative age-related macular degeneration, left eye, advanced atrophic with subfoveal involvement: Secondary | ICD-10-CM | POA: Diagnosis not present

## 2021-04-20 DIAGNOSIS — H43813 Vitreous degeneration, bilateral: Secondary | ICD-10-CM | POA: Diagnosis not present

## 2021-04-20 DIAGNOSIS — H353211 Exudative age-related macular degeneration, right eye, with active choroidal neovascularization: Secondary | ICD-10-CM | POA: Diagnosis not present

## 2021-04-20 DIAGNOSIS — H43393 Other vitreous opacities, bilateral: Secondary | ICD-10-CM | POA: Diagnosis not present

## 2021-04-22 ENCOUNTER — Emergency Department (HOSPITAL_COMMUNITY): Payer: Medicare PPO

## 2021-04-22 ENCOUNTER — Other Ambulatory Visit: Payer: Self-pay

## 2021-04-22 ENCOUNTER — Encounter (HOSPITAL_COMMUNITY): Payer: Self-pay

## 2021-04-22 ENCOUNTER — Emergency Department (HOSPITAL_COMMUNITY)
Admission: EM | Admit: 2021-04-22 | Discharge: 2021-04-22 | Disposition: A | Discharge status: Home / Self Care | Payer: Medicare PPO | Attending: Emergency Medicine | Admitting: Emergency Medicine

## 2021-04-22 DIAGNOSIS — Z7901 Long term (current) use of anticoagulants: Secondary | ICD-10-CM | POA: Insufficient documentation

## 2021-04-22 DIAGNOSIS — M25572 Pain in left ankle and joints of left foot: Secondary | ICD-10-CM | POA: Diagnosis not present

## 2021-04-22 DIAGNOSIS — M25551 Pain in right hip: Secondary | ICD-10-CM | POA: Diagnosis not present

## 2021-04-22 DIAGNOSIS — W1830XA Fall on same level, unspecified, initial encounter: Secondary | ICD-10-CM | POA: Diagnosis not present

## 2021-04-22 DIAGNOSIS — Z96641 Presence of right artificial hip joint: Secondary | ICD-10-CM | POA: Diagnosis not present

## 2021-04-22 DIAGNOSIS — S79911A Unspecified injury of right hip, initial encounter: Secondary | ICD-10-CM | POA: Diagnosis not present

## 2021-04-22 DIAGNOSIS — N400 Enlarged prostate without lower urinary tract symptoms: Secondary | ICD-10-CM | POA: Diagnosis not present

## 2021-04-22 DIAGNOSIS — W19XXXA Unspecified fall, initial encounter: Secondary | ICD-10-CM | POA: Diagnosis not present

## 2021-04-22 DIAGNOSIS — S3993XA Unspecified injury of pelvis, initial encounter: Secondary | ICD-10-CM | POA: Diagnosis not present

## 2021-04-22 DIAGNOSIS — I10 Essential (primary) hypertension: Secondary | ICD-10-CM | POA: Diagnosis not present

## 2021-04-22 DIAGNOSIS — K5730 Diverticulosis of large intestine without perforation or abscess without bleeding: Secondary | ICD-10-CM | POA: Diagnosis not present

## 2021-04-22 MED ORDER — HYDROMORPHONE HCL 1 MG/ML IJ SOLN
1.0000 mg | Freq: Once | INTRAMUSCULAR | Status: AC
  Administered 2021-04-22: 1 mg via INTRAMUSCULAR
  Filled 2021-04-22: qty 1

## 2021-04-22 MED ORDER — OXYCODONE-ACETAMINOPHEN 5-325 MG PO TABS
1.0000 | ORAL_TABLET | Freq: Once | ORAL | Status: AC
  Administered 2021-04-22: 1 via ORAL
  Filled 2021-04-22: qty 1

## 2021-04-22 MED ORDER — METHOCARBAMOL 500 MG PO TABS
500.0000 mg | ORAL_TABLET | Freq: Once | ORAL | Status: AC
  Administered 2021-04-22: 500 mg via ORAL
  Filled 2021-04-22: qty 1

## 2021-04-22 MED ORDER — METHOCARBAMOL 500 MG PO TABS: 500.0000 mg | ORAL_TABLET | Freq: Three times a day (TID) | ORAL | 0 refills | Status: DC | PRN

## 2021-04-22 NOTE — ED Notes (Signed)
Attempted to ambulate pt. Pt was able to stand and bear weight on R, but pt is unable to take a step and states pain to R hip is too severe.  ?

## 2021-04-22 NOTE — Discharge Instructions (Signed)
Recommend following up with your orthopedic doctor regarding your hip pain. ? ?Take Tylenol as needed for pain control.  Can also try the prescribed muscle relaxer as needed. ? ?If you have worsening pain, inability to walk, fever or other new concerning symptom, come back to ER for reassessment. ?

## 2021-04-22 NOTE — ED Triage Notes (Signed)
Pt BIB [facility] from [facility]. Pt reportedly fell 2 says prior without reported injury. Today as pt was getting out of wheelchair, pt experienced a pop and pain to his R hip. Pt A/O4 on arrival.  ? ?EMS Vitals:  ?150/88 ?HR 72 ?RR 16 ?SpO2 98% ?CBG 133 ?

## 2021-04-22 NOTE — ED Provider Notes (Signed)
[facility] EMERGENCY DEPARTMENT Provider Note   CSN: [id number] Arrival date & time: 04/22/21  1807     History  Chief Complaint  Patient presents with   Hip Pain    Stanley Brooks is a 88 y.o. male.  Presented to the emergency room with concern for right hip pain.  Patient reports that 2 days ago he had a ground-level fall, landed on his right hip but the pain was initially felt to be mild and he was able to ambulate without significant issue after the initial fall.  Today however he while transitioning felt a sudden pop in his right hip and sudden onset of pain.  Pain is severe movement and trying to walk rest.  Currently is mild.  Aching.  Denies any other injuries, denies hitting his head.  Wife at bedside verifies patient has otherwise been feeling well and has not had any change in his mental status.  Additional history obtained from EMS report.  HPI     Home Medications Prior to Admission medications   Medication Sig Start Date End Date Taking? Authorizing Provider  acetaminophen (TYLENOL) 325 MG tablet Take 325-650 mg by mouth every 6 (six) hours as needed for mild pain or headache.   Yes [provider]  atorvastatin (LIPITOR) 20 MG tablet TAKE ONE TABLET BY MOUTH EACH NIGHT AT BEDTIME Patient taking differently: Take 20 mg by mouth at bedtime. 12/02/20  Yes Mahlon Gammon, MD  levothyroxine (SYNTHROID) 100 MCG tablet TAKE 1 TABLET BY MOUTH EVERY DAY BEFORE BREAKFAST Patient taking differently: Take 100 mcg by mouth daily before breakfast. 12/26/20  Yes Mahlon Gammon, MD  lisinopril (ZESTRIL) 10 MG tablet TAKE 1 TABLET BY MOUTH EVERY DAY Patient taking differently: Take 10 mg by mouth daily. 04/16/21  Yes Mahlon Gammon, MD  methocarbamol (ROBAXIN) 500 MG tablet Take 1 tablet (500 mg total) by mouth every 8 (eight) hours as needed for muscle spasms. 04/22/21  Yes Kalynne Womac, Quitman Livings, MD  Multiple Vitamins-Minerals (PRESERVISION AREDS PO) Take 1  capsule by mouth 2 (two) times a day.   Yes [provider]  naproxen sodium (ALEVE) 220 MG tablet Take 220 mg by mouth daily as needed (for headaches or pain).   Yes [provider]  rivaroxaban (XARELTO) 20 MG TABS tablet TAKE 1 TABLET BY MOUTH EVERY DAY WITH EVENING MEAL Patient taking differently: Take 20 mg by mouth daily with supper. 11/13/20  Yes Wert, Trula Ore, NP  albuterol (VENTOLIN HFA) 108 (90 Base) MCG/ACT inhaler Inhale 1-2 puffs into the lungs every 6 (six) hours as needed for wheezing or shortness of breath. Patient not taking: Reported on 04/22/2021 12/24/20   Lorre Nick, MD  doxycycline (VIBRAMYCIN) 100 MG capsule Take 1 capsule (100 mg total) by mouth 2 (two) times daily. Patient not taking: Reported on 04/22/2021 12/24/20   Lorre Nick, MD  predniSONE (STERAPRED UNI-PAK 21 TAB) 10 MG (21) TBPK tablet Take by mouth daily. Take 6 tabs by mouth daily  for 2 days, then 5 tabs for 2 days, then 4 tabs for 2 days, then 3 tabs for 2 days, 2 tabs for 2 days, then 1 tab by mouth daily for 2 days Patient not taking: Reported on 04/22/2021 12/24/20   Lorre Nick, MD  sildenafil (VIAGRA) 100 MG tablet Take 1 tablet (100 mg total) by mouth daily as needed for erectile dysfunction. Patient not taking: Reported on 04/22/2021 12/12/17   Bufford Spikes L, DO  Allergies    Neosporin [neomycin-bacitracin zn-polymyx] and Penicillins    Review of Systems   Review of Systems  Constitutional:  Positive for fatigue. Negative for chills and fever.  HENT:  Negative for ear pain and sore throat.   Eyes:  Negative for pain and visual disturbance.  Respiratory:  Negative for cough and shortness of breath.   Cardiovascular:  Negative for chest pain and palpitations.  Gastrointestinal:  Negative for abdominal pain and vomiting.  Genitourinary:  Negative for dysuria and hematuria.  Musculoskeletal:  Positive for arthralgias. Negative for back pain.  Skin:  Negative for color  change and rash.  Neurological:  Negative for seizures and syncope.  All other systems reviewed and are negative.  Physical Exam Updated Vital Signs BP (!) 153/91   Pulse 67   Temp 98.1 F (36.7 C) (Oral)   Resp 20   Ht 6\' 5"  (1.956 m)   Wt 90.7 kg   SpO2 92%   BMI 23.72 kg/m  Physical Exam Vitals and nursing note reviewed.  Constitutional:      General: He is not in acute distress.    Appearance: He is well-developed.  HENT:     Head: Normocephalic and atraumatic.  Eyes:     Conjunctiva/sclera: Conjunctivae normal.  Cardiovascular:     Rate and Rhythm: Normal rate.     Pulses: Normal pulses.  Pulmonary:     Effort: Pulmonary effort is normal. No respiratory distress.     Breath sounds: Normal breath sounds.  Abdominal:     Palpations: Abdomen is soft.     Tenderness: There is no abdominal tenderness.  Musculoskeletal:     Cervical back: Neck supple.     Comments: Back: No tenderness to the CT or L-spine Right lower extremity: There is some tenderness to the hip, there is normal hip range of motion, there is no tenderness throughout the remainder of the extremity, normal DP and PT pulses, normal distal sensation and motor  Skin:    General: Skin is warm and dry.     Capillary Refill: Capillary refill takes less than 2 seconds.  Neurological:     Mental Status: He is alert.  Psychiatric:        Mood and Affect: Mood normal.    ED Results / Procedures / Treatments   Labs (all labs ordered are listed, but only abnormal results are displayed) Labs Reviewed - No data to display  EKG None  Radiology CT PELVIS WO CONTRAST  Result Date: 04/22/2021 CLINICAL DATA:  Trauma.  Right hip pain. EXAM: CT PELVIS WITHOUT CONTRAST TECHNIQUE: Multidetector CT imaging of the pelvis was performed following the standard protocol without intravenous contrast. RADIATION DOSE REDUCTION: This exam was performed according to the departmental dose-optimization program which includes  automated exposure control, adjustment of the mA and/or kV according to patient size and/or use of iterative reconstruction technique. COMPARISON:  By radiograph dated 04/22/2021. FINDINGS: Evaluation of this exam is limited in the absence of intravenous contrast as well as streak artifact caused by right hip arthroplasty. Urinary Tract: The visualized ureters and urinary bladder appear unremarkable. Bowel: There is colonic diverticulosis. No bowel obstruction within the pelvis. Vascular/Lymphatic: Advanced aortoiliac atherosclerotic disease. The IVC is unremarkable. No adenopathy. Reproductive: The prostate gland is enlarged measuring 6 cm in transverse axial diameter. There is median lobe hypertrophy indenting the base of the bladder. Other:  None Musculoskeletal: Osteopenia with degenerative changes of the spine. Right hip arthroplasty appears intact and in anatomic alignment.  Heterotopic ossification adjacent to the greater trochanter of the right femur. No acute osseous pathology. IMPRESSION: 1. No acute fracture or dislocation. 2. Right hip arthroplasty appears intact and in anatomic alignment. 3. Colonic diverticulosis. 4. Enlarged prostate gland with median lobe hypertrophy indenting the base of the bladder. 5. Aortic Atherosclerosis (ICD10-I70.0). Electronically Signed   By: Elgie Collard M.D.   On: 04/22/2021 23:08   DG Hip Unilat  With Pelvis 2-3 Views Right  Result Date: 04/22/2021 CLINICAL DATA:  Injury. EXAM: DG HIP (WITH OR WITHOUT PELVIS) 2-3V RIGHT COMPARISON:  None. FINDINGS: Right hip arthroplasty is present. There is no evidence for acute fracture or dislocation. There is no hardware loosening. Heterotopic ossification seen along the lateral aspect of the right hip. The bones are osteopenic. There are vascular calcification soft tissues. IVC filter is partially visualized. IMPRESSION: 1. No acute bony abnormality. 2. Right hip arthroplasty appears uncomplicated. Electronically Signed   By:  Darliss Cheney M.D.   On: 04/22/2021 19:07    Procedures Procedures    Medications Ordered in ED Medications  oxyCODONE-acetaminophen (PERCOCET/ROXICET) 5-325 MG per tablet 1 tablet (1 tablet Oral Given 04/22/21 2002)  methocarbamol (ROBAXIN) tablet 500 mg (500 mg Oral Given 04/22/21 2215)  HYDROmorphone (DILAUDID) injection 1 mg (1 mg Intramuscular Given 04/22/21 2216)    ED Course/ Medical Decision Making/ A&P                           Medical Decision Making Amount and/or Complexity of Data Reviewed Radiology: ordered.  Risk Prescription drug management.   88 year old gentleman presenting to ER with concern for right hip pain after fall 2 days ago.  Today with some tenderness to the hip but had normal hip range of motion.  No other traumatic findings on exam.  Plain films were negative.  Patient was able to stand but had significant pain still and proceeded to check CT pelvis to rule out occult hip or pelvis fracture.  CT was negative.  I independently reviewed the x-ray and CT findings.  Patient was provided with additional pain medicine and had significant improvement in his symptoms.  Given patient's symptoms are currently well controlled and had reassuring imaging, patient can be discharged.  Patient is at an independent living facility and reports that they have physical therapy in house.  Encourage patient to seek physical therapy evaluation at his facility and follow-up with his primary care doctor and his orthopedist who did the hip replacement many years ago.    After the discussed management above, the patient was determined to be safe for discharge.  The patient was in agreement with this plan and all questions regarding their care were answered.  ED return precautions were discussed and the patient will return to the ED with any significant worsening of condition.         Final Clinical Impression(s) / ED Diagnoses Final diagnoses:  Right hip pain    Rx / DC  Orders ED Discharge Orders          Ordered    methocarbamol (ROBAXIN) 500 MG tablet  Every 8 hours PRN        04/22/21 2315              Milagros Loll, MD 04/23/21 1515

## 2021-04-24 DIAGNOSIS — Z96641 Presence of right artificial hip joint: Secondary | ICD-10-CM | POA: Diagnosis not present

## 2021-04-24 DIAGNOSIS — M25551 Pain in right hip: Secondary | ICD-10-CM | POA: Diagnosis not present

## 2021-04-28 DIAGNOSIS — R278 Other lack of coordination: Secondary | ICD-10-CM | POA: Diagnosis not present

## 2021-04-28 DIAGNOSIS — R2689 Other abnormalities of gait and mobility: Secondary | ICD-10-CM | POA: Diagnosis not present

## 2021-04-28 DIAGNOSIS — R54 Age-related physical debility: Secondary | ICD-10-CM | POA: Diagnosis not present

## 2021-04-28 DIAGNOSIS — M62562 Muscle wasting and atrophy, not elsewhere classified, left lower leg: Secondary | ICD-10-CM | POA: Diagnosis not present

## 2021-04-28 DIAGNOSIS — Z9181 History of falling: Secondary | ICD-10-CM | POA: Diagnosis not present

## 2021-04-28 DIAGNOSIS — R2681 Unsteadiness on feet: Secondary | ICD-10-CM | POA: Diagnosis not present

## 2021-04-28 DIAGNOSIS — M62561 Muscle wasting and atrophy, not elsewhere classified, right lower leg: Secondary | ICD-10-CM | POA: Diagnosis not present

## 2021-04-30 DIAGNOSIS — R2681 Unsteadiness on feet: Secondary | ICD-10-CM | POA: Diagnosis not present

## 2021-04-30 DIAGNOSIS — M62561 Muscle wasting and atrophy, not elsewhere classified, right lower leg: Secondary | ICD-10-CM | POA: Diagnosis not present

## 2021-04-30 DIAGNOSIS — R278 Other lack of coordination: Secondary | ICD-10-CM | POA: Diagnosis not present

## 2021-04-30 DIAGNOSIS — R2689 Other abnormalities of gait and mobility: Secondary | ICD-10-CM | POA: Diagnosis not present

## 2021-04-30 DIAGNOSIS — M62562 Muscle wasting and atrophy, not elsewhere classified, left lower leg: Secondary | ICD-10-CM | POA: Diagnosis not present

## 2021-04-30 DIAGNOSIS — R54 Age-related physical debility: Secondary | ICD-10-CM | POA: Diagnosis not present

## 2021-04-30 DIAGNOSIS — Z9181 History of falling: Secondary | ICD-10-CM | POA: Diagnosis not present

## 2021-05-04 DIAGNOSIS — R278 Other lack of coordination: Secondary | ICD-10-CM | POA: Diagnosis not present

## 2021-05-04 DIAGNOSIS — R54 Age-related physical debility: Secondary | ICD-10-CM | POA: Diagnosis not present

## 2021-05-04 DIAGNOSIS — M62562 Muscle wasting and atrophy, not elsewhere classified, left lower leg: Secondary | ICD-10-CM | POA: Diagnosis not present

## 2021-05-04 DIAGNOSIS — Z9181 History of falling: Secondary | ICD-10-CM | POA: Diagnosis not present

## 2021-05-04 DIAGNOSIS — R2681 Unsteadiness on feet: Secondary | ICD-10-CM | POA: Diagnosis not present

## 2021-05-04 DIAGNOSIS — M62561 Muscle wasting and atrophy, not elsewhere classified, right lower leg: Secondary | ICD-10-CM | POA: Diagnosis not present

## 2021-05-04 DIAGNOSIS — R2689 Other abnormalities of gait and mobility: Secondary | ICD-10-CM | POA: Diagnosis not present

## 2021-05-06 DIAGNOSIS — M62561 Muscle wasting and atrophy, not elsewhere classified, right lower leg: Secondary | ICD-10-CM | POA: Diagnosis not present

## 2021-05-06 DIAGNOSIS — R2681 Unsteadiness on feet: Secondary | ICD-10-CM | POA: Diagnosis not present

## 2021-05-06 DIAGNOSIS — R2689 Other abnormalities of gait and mobility: Secondary | ICD-10-CM | POA: Diagnosis not present

## 2021-05-06 DIAGNOSIS — R54 Age-related physical debility: Secondary | ICD-10-CM | POA: Diagnosis not present

## 2021-05-06 DIAGNOSIS — Z9181 History of falling: Secondary | ICD-10-CM | POA: Diagnosis not present

## 2021-05-06 DIAGNOSIS — M62562 Muscle wasting and atrophy, not elsewhere classified, left lower leg: Secondary | ICD-10-CM | POA: Diagnosis not present

## 2021-05-06 DIAGNOSIS — R278 Other lack of coordination: Secondary | ICD-10-CM | POA: Diagnosis not present

## 2021-05-11 DIAGNOSIS — R2689 Other abnormalities of gait and mobility: Secondary | ICD-10-CM | POA: Diagnosis not present

## 2021-05-11 DIAGNOSIS — Z9181 History of falling: Secondary | ICD-10-CM | POA: Diagnosis not present

## 2021-05-11 DIAGNOSIS — R2681 Unsteadiness on feet: Secondary | ICD-10-CM | POA: Diagnosis not present

## 2021-05-11 DIAGNOSIS — R54 Age-related physical debility: Secondary | ICD-10-CM | POA: Diagnosis not present

## 2021-05-11 DIAGNOSIS — R278 Other lack of coordination: Secondary | ICD-10-CM | POA: Diagnosis not present

## 2021-05-11 DIAGNOSIS — M62562 Muscle wasting and atrophy, not elsewhere classified, left lower leg: Secondary | ICD-10-CM | POA: Diagnosis not present

## 2021-05-11 DIAGNOSIS — M62561 Muscle wasting and atrophy, not elsewhere classified, right lower leg: Secondary | ICD-10-CM | POA: Diagnosis not present

## 2021-05-13 DIAGNOSIS — R2681 Unsteadiness on feet: Secondary | ICD-10-CM | POA: Diagnosis not present

## 2021-05-13 DIAGNOSIS — M62562 Muscle wasting and atrophy, not elsewhere classified, left lower leg: Secondary | ICD-10-CM | POA: Diagnosis not present

## 2021-05-13 DIAGNOSIS — R2689 Other abnormalities of gait and mobility: Secondary | ICD-10-CM | POA: Diagnosis not present

## 2021-05-13 DIAGNOSIS — R54 Age-related physical debility: Secondary | ICD-10-CM | POA: Diagnosis not present

## 2021-05-13 DIAGNOSIS — Z9181 History of falling: Secondary | ICD-10-CM | POA: Diagnosis not present

## 2021-05-13 DIAGNOSIS — R278 Other lack of coordination: Secondary | ICD-10-CM | POA: Diagnosis not present

## 2021-05-13 DIAGNOSIS — M62561 Muscle wasting and atrophy, not elsewhere classified, right lower leg: Secondary | ICD-10-CM | POA: Diagnosis not present

## 2021-05-18 ENCOUNTER — Other Ambulatory Visit: Payer: Self-pay | Admitting: Internal Medicine

## 2021-05-18 ENCOUNTER — Other Ambulatory Visit: Payer: Self-pay | Admitting: Adult Health

## 2021-05-18 NOTE — Telephone Encounter (Signed)
Patient has request refill on medication "Xarelto 20mg ". Patient last refill dated 11/13/2020. Patient medication has High Risk Warnings. Medication pend and sent to PCP Virgie Dad, MD for approval.  ?

## 2021-05-20 DIAGNOSIS — M62562 Muscle wasting and atrophy, not elsewhere classified, left lower leg: Secondary | ICD-10-CM | POA: Diagnosis not present

## 2021-05-20 DIAGNOSIS — R2681 Unsteadiness on feet: Secondary | ICD-10-CM | POA: Diagnosis not present

## 2021-05-20 DIAGNOSIS — Z9181 History of falling: Secondary | ICD-10-CM | POA: Diagnosis not present

## 2021-05-20 DIAGNOSIS — R278 Other lack of coordination: Secondary | ICD-10-CM | POA: Diagnosis not present

## 2021-05-20 DIAGNOSIS — M62561 Muscle wasting and atrophy, not elsewhere classified, right lower leg: Secondary | ICD-10-CM | POA: Diagnosis not present

## 2021-05-20 DIAGNOSIS — R54 Age-related physical debility: Secondary | ICD-10-CM | POA: Diagnosis not present

## 2021-05-20 DIAGNOSIS — R2689 Other abnormalities of gait and mobility: Secondary | ICD-10-CM | POA: Diagnosis not present

## 2021-05-21 IMAGING — CR DG RIBS 2V*R*
4 series · 4 of 4 positions shown · non-contrast
Comparison: None.

CLINICAL DATA: Fall with right rib cage pain

EXAM:
RIGHT RIBS - 2 VIEW

[w chest pa]
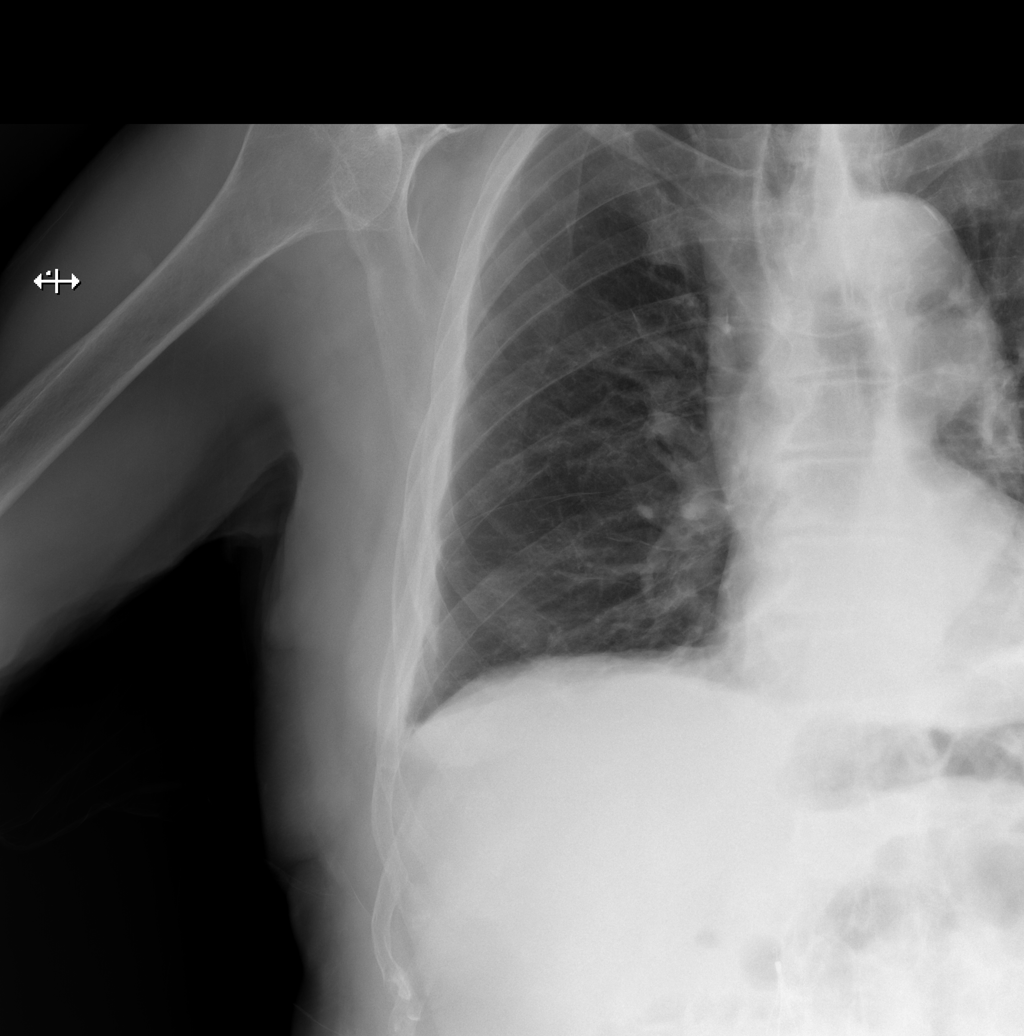

[w ribs ap upper right]
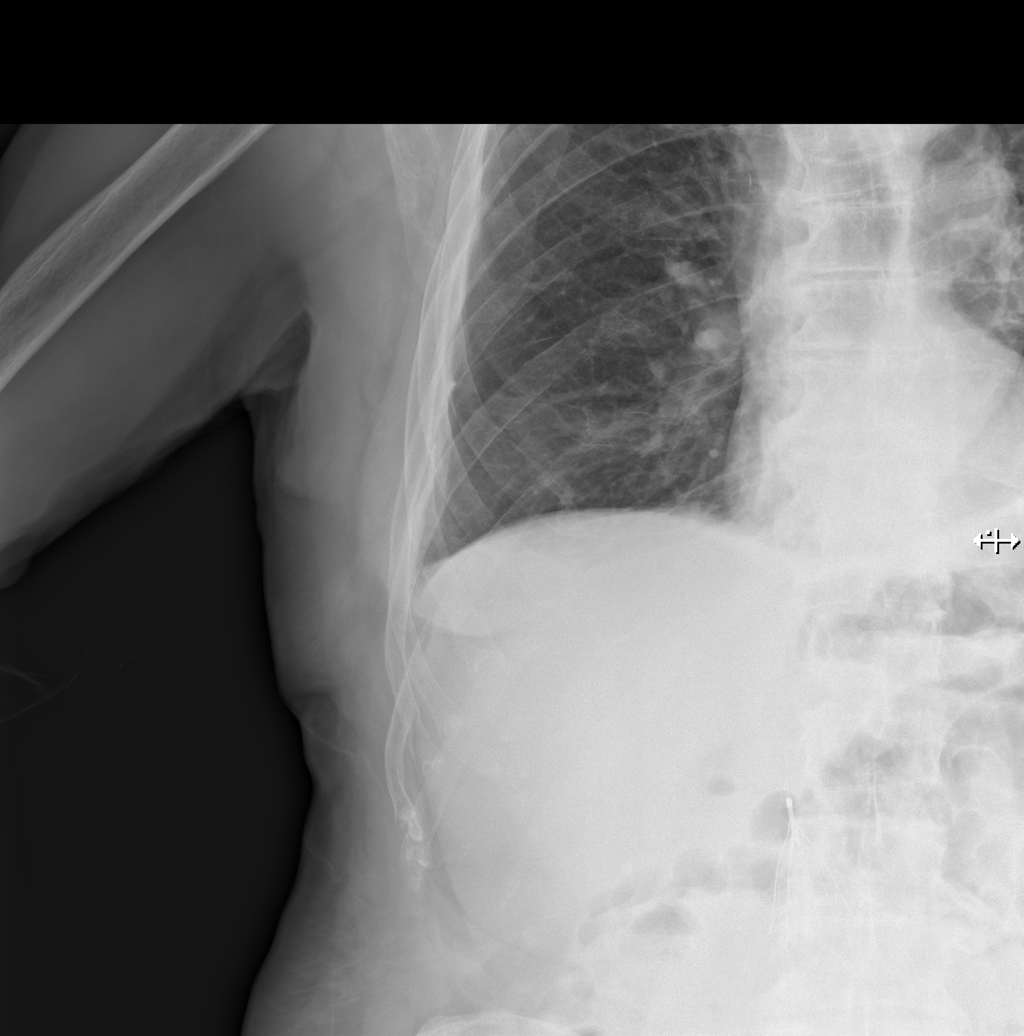

[w ribs obl right (1 of 2)]
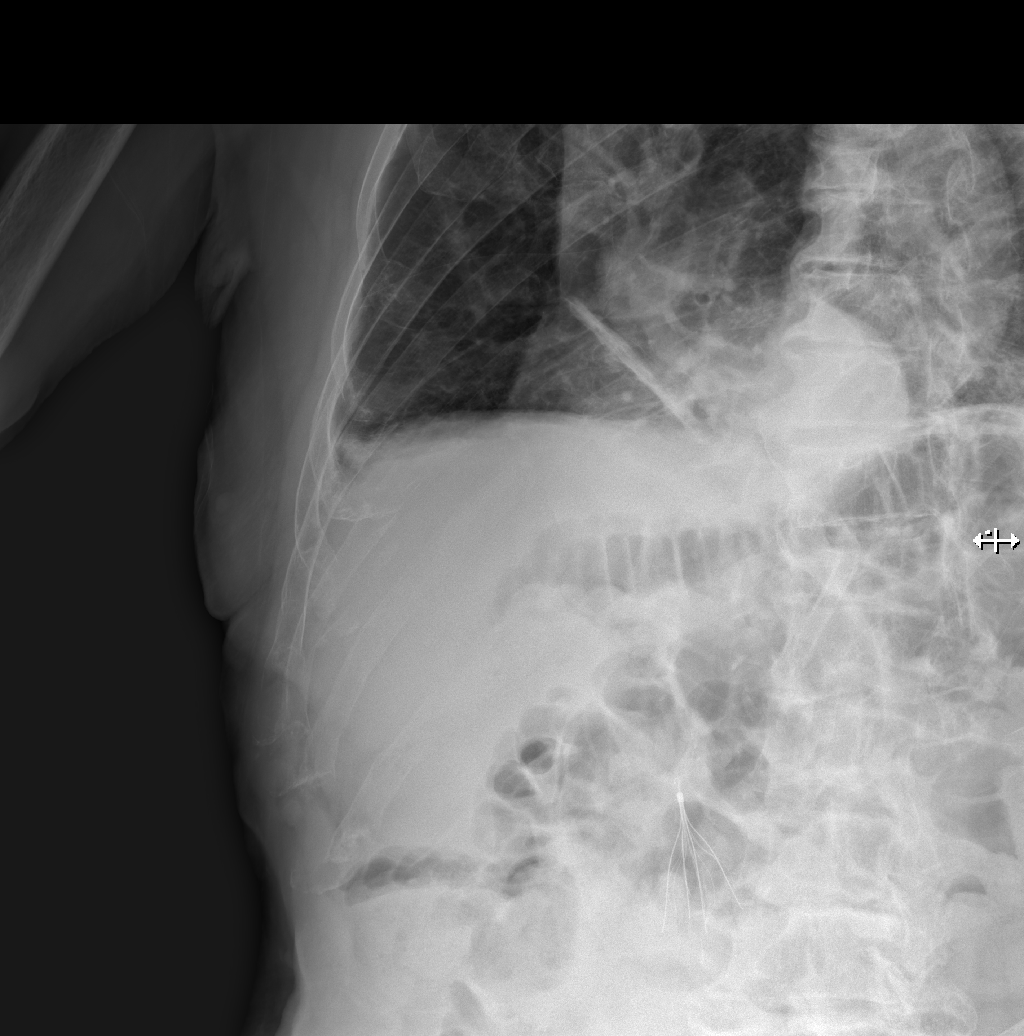

[w ribs obl right (2 of 2)]
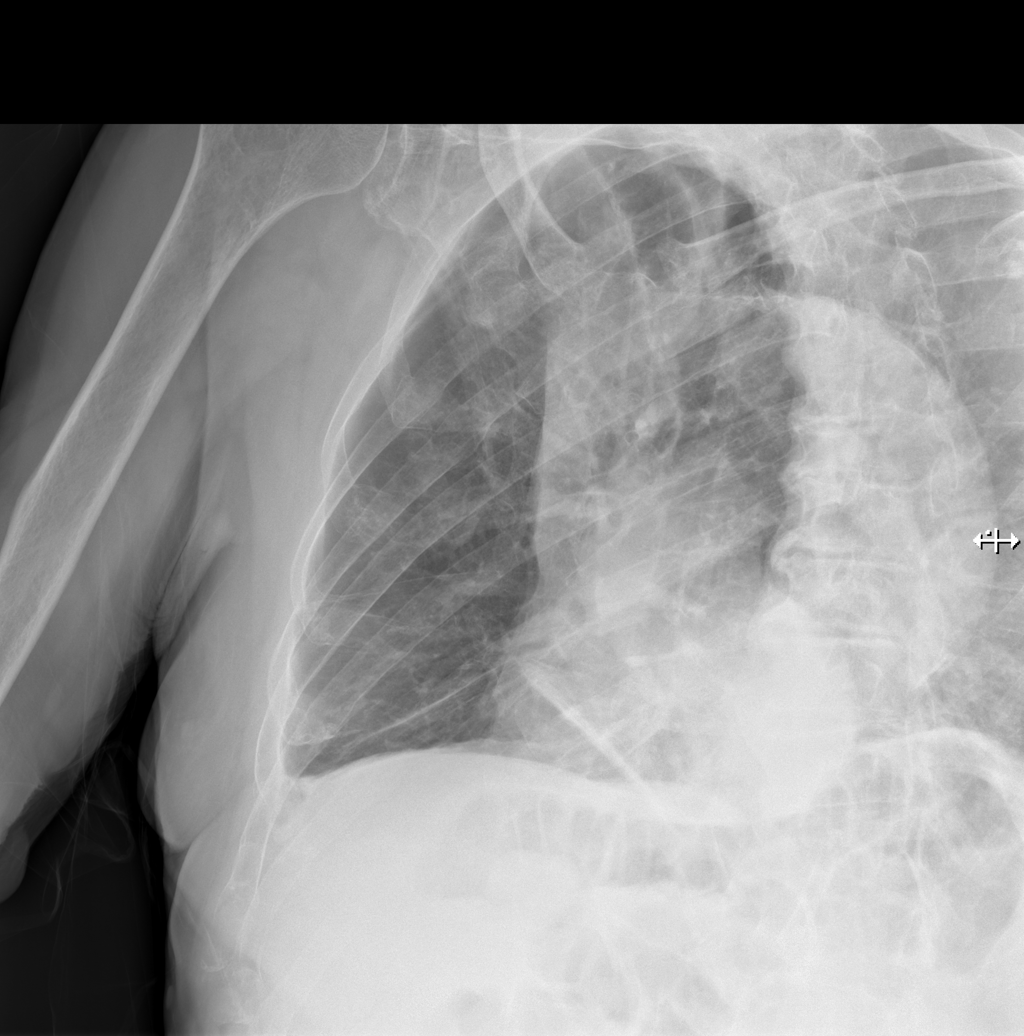

[4 of 4 positions shown; findings below may reference images not displayed]

FINDINGS: IVC filter is noted. Age indeterminate right seventh, eighth, ninth,
tenth and eleventh anterolateral rib fractures. No pneumothorax.
IMPRESSION: Age indeterminate right seventh through eleventh anterolateral rib
fractures.

## 2021-05-23 ENCOUNTER — Other Ambulatory Visit: Payer: Self-pay | Admitting: Internal Medicine

## 2021-05-27 DIAGNOSIS — H43813 Vitreous degeneration, bilateral: Secondary | ICD-10-CM | POA: Diagnosis not present

## 2021-05-27 DIAGNOSIS — H353211 Exudative age-related macular degeneration, right eye, with active choroidal neovascularization: Secondary | ICD-10-CM | POA: Diagnosis not present

## 2021-05-27 DIAGNOSIS — H43393 Other vitreous opacities, bilateral: Secondary | ICD-10-CM | POA: Diagnosis not present

## 2021-05-27 DIAGNOSIS — H353124 Nonexudative age-related macular degeneration, left eye, advanced atrophic with subfoveal involvement: Secondary | ICD-10-CM | POA: Diagnosis not present

## 2021-06-03 DIAGNOSIS — R54 Age-related physical debility: Secondary | ICD-10-CM | POA: Diagnosis not present

## 2021-06-03 DIAGNOSIS — Z9181 History of falling: Secondary | ICD-10-CM | POA: Diagnosis not present

## 2021-06-03 DIAGNOSIS — R2681 Unsteadiness on feet: Secondary | ICD-10-CM | POA: Diagnosis not present

## 2021-06-03 DIAGNOSIS — R2689 Other abnormalities of gait and mobility: Secondary | ICD-10-CM | POA: Diagnosis not present

## 2021-06-03 DIAGNOSIS — M62562 Muscle wasting and atrophy, not elsewhere classified, left lower leg: Secondary | ICD-10-CM | POA: Diagnosis not present

## 2021-06-03 DIAGNOSIS — R278 Other lack of coordination: Secondary | ICD-10-CM | POA: Diagnosis not present

## 2021-06-03 DIAGNOSIS — M62561 Muscle wasting and atrophy, not elsewhere classified, right lower leg: Secondary | ICD-10-CM | POA: Diagnosis not present

## 2021-06-08 DIAGNOSIS — R2689 Other abnormalities of gait and mobility: Secondary | ICD-10-CM | POA: Diagnosis not present

## 2021-06-08 DIAGNOSIS — R278 Other lack of coordination: Secondary | ICD-10-CM | POA: Diagnosis not present

## 2021-06-08 DIAGNOSIS — Z9181 History of falling: Secondary | ICD-10-CM | POA: Diagnosis not present

## 2021-06-08 DIAGNOSIS — R2681 Unsteadiness on feet: Secondary | ICD-10-CM | POA: Diagnosis not present

## 2021-06-08 DIAGNOSIS — M62561 Muscle wasting and atrophy, not elsewhere classified, right lower leg: Secondary | ICD-10-CM | POA: Diagnosis not present

## 2021-06-08 DIAGNOSIS — M62562 Muscle wasting and atrophy, not elsewhere classified, left lower leg: Secondary | ICD-10-CM | POA: Diagnosis not present

## 2021-06-08 DIAGNOSIS — R54 Age-related physical debility: Secondary | ICD-10-CM | POA: Diagnosis not present

## 2021-06-23 DIAGNOSIS — E785 Hyperlipidemia, unspecified: Secondary | ICD-10-CM | POA: Diagnosis not present

## 2021-06-23 DIAGNOSIS — E039 Hypothyroidism, unspecified: Secondary | ICD-10-CM | POA: Diagnosis not present

## 2021-06-23 LAB — BASIC METABOLIC PANEL
BUN: 40 — AB (ref 4–21)
CO2: 18 (ref 13–22)
Chloride: 111 — AB (ref 99–108)
Creatinine: 2.8 — AB (ref 0.6–1.3)
Glucose: 96
Potassium: 4.8 mEq/L (ref 3.5–5.1)
Sodium: 146 (ref 137–147)

## 2021-06-23 LAB — CBC AND DIFFERENTIAL
HCT: 40 — AB (ref 41–53)
Hemoglobin: 13.7 (ref 13.5–17.5)
Platelets: 156 10*3/uL (ref 150–400)
WBC: 4.5

## 2021-06-23 LAB — CBC: RBC: 4.22 (ref 3.87–5.11)

## 2021-06-23 LAB — TSH: TSH: 7.42 — AB (ref 0.41–5.90)

## 2021-06-23 LAB — COMPREHENSIVE METABOLIC PANEL: Calcium: 8.8 (ref 8.7–10.7)

## 2021-06-25 DIAGNOSIS — H353211 Exudative age-related macular degeneration, right eye, with active choroidal neovascularization: Secondary | ICD-10-CM | POA: Diagnosis not present

## 2021-06-25 DIAGNOSIS — H353124 Nonexudative age-related macular degeneration, left eye, advanced atrophic with subfoveal involvement: Secondary | ICD-10-CM | POA: Diagnosis not present

## 2021-07-01 ENCOUNTER — Encounter: Payer: Self-pay | Admitting: Internal Medicine

## 2021-07-01 ENCOUNTER — Non-Acute Institutional Stay: Payer: Medicare PPO | Admitting: Internal Medicine

## 2021-07-01 VITALS — BP 162/86 | HR 72 | Temp 97.9°F | Ht 77.0 in | Wt 187.4 lb

## 2021-07-01 DIAGNOSIS — E7800 Pure hypercholesterolemia, unspecified: Secondary | ICD-10-CM

## 2021-07-01 DIAGNOSIS — N529 Male erectile dysfunction, unspecified: Secondary | ICD-10-CM

## 2021-07-01 DIAGNOSIS — I10 Essential (primary) hypertension: Secondary | ICD-10-CM | POA: Diagnosis not present

## 2021-07-01 DIAGNOSIS — E039 Hypothyroidism, unspecified: Secondary | ICD-10-CM

## 2021-07-01 DIAGNOSIS — I82403 Acute embolism and thrombosis of unspecified deep veins of lower extremity, bilateral: Secondary | ICD-10-CM

## 2021-07-01 DIAGNOSIS — W19XXXS Unspecified fall, sequela: Secondary | ICD-10-CM

## 2021-07-01 DIAGNOSIS — N184 Chronic kidney disease, stage 4 (severe): Secondary | ICD-10-CM | POA: Diagnosis not present

## 2021-07-01 NOTE — Progress Notes (Signed)
Location:  [place] of Service:  Clinic (12)  Provider:   Code Status:  Goals of Care:     06/30/2021    4:38 PM  Advanced Directives  Does Patient Have a Medical Advance Directive? Yes  Type of Advance Directive Out of facility DNR (pink MOST or yellow form);Living will;Healthcare Power of Attorney  Does patient want to make changes to medical advance directive? No - Patient declined  Copy of [place] in Chart? Yes - validated most recent copy scanned in chart (See row information)  Pre-existing out of facility DNR order (yellow form or pink MOST form) Yellow form placed in chart (order not valid for inpatient use)     Chief Complaint  Patient presents with   Medical Management of Chronic Issues    Patient returns to the clinic for his 6 month follow up.    Quality Metric Gaps    Verified [facility] and [facility] patient is due for TDAP and shingrix    HPI: Patient is a 88 y.o. male seen today for medical management of chronic diseases.    Has history of macular degeneration.  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 Was seen in ED after his fall 2 months ago Xrays and CT  were negative Seen by Ortho and was injected in right hip  Uses Kasandra Knudsen now but still seems unstable Planning to Drive to beach with is wife Did not have any acute complains Past Medical History:  Diagnosis Date   Cataract 2008   left and right eyde By Dr. Jodi Mourning    DVT (deep venous thrombosis) ([place])    Exudative age-related macular degeneration of right eye with active choroidal neovascularization ([place])    Hypercholesterolemia    Hypertension    Hypothyroid    Kidney stone    Vitreous detachment, bilateral 03/15/2019    Past Surgical History:  Procedure Laterality  Date   CATARACT EXTRACTION W/ INTRAOCULAR LENS IMPLANT Bilateral    COLONOSCOPY     Dr. Alvia Grove, prior polyps, but last due to age   HEMORROIDECTOMY  1984   Dr. Sherilyn Dacosta repair of right leg  Right    TOTAL HIP ARTHROPLASTY      Allergies  Allergen Reactions   Neosporin [Neomycin-Bacitracin Zn-Polymyx] Dermatitis   Penicillins Other (See Comments)    Reaction not recalled, but the "reaction was bad"    Outpatient Encounter Medications as of 07/01/2021  Medication Sig   acetaminophen (TYLENOL) 325 MG tablet Take 325-650 mg by mouth every 6 (six) hours as needed for mild pain or headache.   atorvastatin (LIPITOR) 20 MG tablet TAKE ONE TABLET BY MOUTH EACH NIGHT AT BEDTIME   levothyroxine (SYNTHROID) 100 MCG tablet TAKE 1 TABLET BY MOUTH EVERY DAY BEFORE BREAKFAST (Patient taking differently: Take 100 mcg by mouth daily before breakfast.)   lisinopril (ZESTRIL) 10 MG tablet TAKE 1 TABLET BY MOUTH EVERY DAY (Patient taking differently: Take 10 mg by mouth daily.)   methocarbamol (ROBAXIN) 500 MG tablet Take 1 tablet (500 mg total) by mouth every 8 (eight) hours as needed for muscle spasms.   Multiple Vitamins-Minerals (PRESERVISION AREDS PO) Take 1 capsule by mouth 2 (two) times a day.   naproxen sodium (ALEVE) 220 MG tablet Take 220 mg by mouth daily as needed (  for headaches or pain).   sildenafil (VIAGRA) 100 MG tablet Take 1 tablet (100 mg total) by mouth daily as needed for erectile dysfunction.   XARELTO 20 MG TABS tablet TAKE 1 TABLET BY MOUTH EVERY DAY WITH EVENING MEAL   predniSONE (STERAPRED UNI-PAK 21 TAB) 10 MG (21) TBPK tablet Take by mouth daily. Take 6 tabs by mouth daily  for 2 days, then 5 tabs for 2 days, then 4 tabs for 2 days, then 3 tabs for 2 days, 2 tabs for 2 days, then 1 tab by mouth daily for 2 days (Patient not taking: Reported on 04/22/2021)   [DISCONTINUED] albuterol (VENTOLIN HFA) 108 (90 Base) MCG/ACT inhaler Inhale 1-2 puffs into the lungs every 6 (six)  hours as needed for wheezing or shortness of breath. (Patient not taking: Reported on 04/22/2021)   [DISCONTINUED] doxycycline (VIBRAMYCIN) 100 MG capsule Take 1 capsule (100 mg total) by mouth 2 (two) times daily. (Patient not taking: Reported on 04/22/2021)   No facility-administered encounter medications on file as of 07/01/2021.    Review of Systems:  Review of Systems  Constitutional:  Negative for activity change, appetite change and unexpected weight change.  HENT: Negative.    Respiratory:  Negative for cough and shortness of breath.   Cardiovascular:  Positive for leg swelling.  Gastrointestinal:  Negative for constipation.  Genitourinary:  Negative for frequency.  Musculoskeletal:  Positive for gait problem. Negative for arthralgias and myalgias.  Skin: Negative.  Negative for rash.  Neurological:  Negative for dizziness and weakness.  Psychiatric/Behavioral:  Negative for confusion and sleep disturbance.   All other systems reviewed and are negative.   Health Maintenance  Topic Date Due   TETANUS/TDAP  Never done   Zoster Vaccines- Shingrix (1 of 2) Never done   COVID-19 Vaccine (4 - Booster for Moderna series) 01/07/2022 (Originally 01/16/2021)   INFLUENZA VACCINE  09/08/2021   Pneumonia Vaccine 31+ Years old  Completed   HPV VACCINES  Aged Out    Physical Exam: Vitals:   07/01/21 1030  BP: (!) 162/86  Pulse: 72  Temp: 97.9 F (36.6 C)  SpO2: 99%  Weight: 187 lb 6.4 oz (85 kg)  Height: 6\' 5"  (1.956 m)   Body mass index is 22.22 kg/m. Physical Exam Vitals reviewed.  Constitutional:      Appearance: Normal appearance.  HENT:     Head: Normocephalic.     Nose: Nose normal.     Mouth/Throat:     Mouth: Mucous membranes are moist.     Pharynx: Oropharynx is clear.  Eyes:     Pupils: Pupils are equal, round, and reactive to light.  Cardiovascular:     Rate and Rhythm: Normal rate and regular rhythm.     Pulses: Normal pulses.     Heart sounds: No murmur  heard. Pulmonary:     Effort: Pulmonary effort is normal. No respiratory distress.     Breath sounds: Normal breath sounds. No rales.  Abdominal:     General: Abdomen is flat. Bowel sounds are normal.     Palpations: Abdomen is soft.  Musculoskeletal:        General: Swelling present.     Cervical back: Neck supple.  Skin:    General: Skin is warm.  Neurological:     General: No focal deficit present.     Mental Status: He is alert and oriented to person, place, and time.     Comments: Gait is not stable even with Kasandra Knudsen  Psychiatric:        Mood and Affect: Mood normal.        Thought Content: Thought content normal.     Labs reviewed: Basic Metabolic Panel: Recent Labs    07/08/20 0000 06/23/21 0000  NA 141 146  K 4.2 4.8  CL 104 111*  CO2 22 18  BUN 32* 40*  CREATININE 2.6* 2.8*  CALCIUM 9.1 8.8  TSH 2.57 7.42*   Liver Function Tests: Recent Labs    07/08/20 0000  AST 22  ALT 16  ALKPHOS 123  ALBUMIN 4.6   No results for input(s): LIPASE, AMYLASE in the last 8760 hours. No results for input(s): AMMONIA in the last 8760 hours. CBC: Recent Labs    07/08/20 0000 09/02/20 1417 06/23/21 0000  WBC 3.9 5.0 4.5  NEUTROABS  --  3,800  --   HGB 15.4 14.0 13.7  HCT 45 42.2 40*  MCV  --  96.8  --   PLT 124* 146 156   Lipid Panel: Recent Labs    07/08/20 0000  CHOL 200  HDL 49  LDLCALC 126  TRIG 125   No results found for: HGBA1C  Procedures since last visit: No results found.  Assessment/Plan 1. Stage 4 chronic kidney disease (HCC) Worsening Creat Repeat BMP in 8 weeks if continue to get worse  consider Renal referal   2. Acquired hypothyroidism TSH Mildly Elevated Reviewed with him how to take it in the morning Empty Stomach Repeat in 3 months  3. Essential hypertension BP is also Elevated Talked to facility Nurse and she will check on him at home On Lisinopril low dose   4. Pure hypercholesterolemia On Lipitor   5. 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 Erectile dysfunction, unspecified erectile dysfunction type Uses   7. Fall, sequela Needs to use Walker Continues to stay very unstable Planning to go to the beach   Labs/tests ordered:  * No order type specified * Next appt:  09/28/2021

## 2021-07-29 DIAGNOSIS — H353211 Exudative age-related macular degeneration, right eye, with active choroidal neovascularization: Secondary | ICD-10-CM | POA: Diagnosis not present

## 2021-07-29 DIAGNOSIS — H33322 Round hole, left eye: Secondary | ICD-10-CM | POA: Diagnosis not present

## 2021-08-06 DIAGNOSIS — Z85828 Personal history of other malignant neoplasm of skin: Secondary | ICD-10-CM | POA: Diagnosis not present

## 2021-08-06 DIAGNOSIS — L570 Actinic keratosis: Secondary | ICD-10-CM | POA: Diagnosis not present

## 2021-08-06 DIAGNOSIS — D1801 Hemangioma of skin and subcutaneous tissue: Secondary | ICD-10-CM | POA: Diagnosis not present

## 2021-08-06 DIAGNOSIS — Z08 Encounter for follow-up examination after completed treatment for malignant neoplasm: Secondary | ICD-10-CM | POA: Diagnosis not present

## 2021-08-06 DIAGNOSIS — L820 Inflamed seborrheic keratosis: Secondary | ICD-10-CM | POA: Diagnosis not present

## 2021-08-06 DIAGNOSIS — L578 Other skin changes due to chronic exposure to nonionizing radiation: Secondary | ICD-10-CM | POA: Diagnosis not present

## 2021-08-06 DIAGNOSIS — L304 Erythema intertrigo: Secondary | ICD-10-CM | POA: Diagnosis not present

## 2021-08-06 DIAGNOSIS — Z85820 Personal history of malignant melanoma of skin: Secondary | ICD-10-CM | POA: Diagnosis not present

## 2021-08-08 ENCOUNTER — Other Ambulatory Visit: Payer: Self-pay | Admitting: Internal Medicine

## 2021-08-17 DIAGNOSIS — H6123 Impacted cerumen, bilateral: Secondary | ICD-10-CM | POA: Diagnosis not present

## 2021-08-20 ENCOUNTER — Other Ambulatory Visit: Payer: Self-pay | Admitting: Internal Medicine

## 2021-08-20 DIAGNOSIS — E639 Nutritional deficiency, unspecified: Secondary | ICD-10-CM | POA: Diagnosis not present

## 2021-08-20 DIAGNOSIS — I10 Essential (primary) hypertension: Secondary | ICD-10-CM | POA: Diagnosis not present

## 2021-08-20 DIAGNOSIS — N184 Chronic kidney disease, stage 4 (severe): Secondary | ICD-10-CM | POA: Diagnosis not present

## 2021-08-20 LAB — COMPREHENSIVE METABOLIC PANEL
Calcium: 8.6 — AB (ref 8.7–10.7)
eGFR: 21

## 2021-08-20 LAB — BASIC METABOLIC PANEL
BUN: 39 — AB (ref 4–21)
CO2: 19 (ref 13–22)
Chloride: 105 (ref 99–108)
Creatinine: 2.7 — AB (ref 0.6–1.3)
Glucose: 128
Potassium: 5 mEq/L (ref 3.5–5.1)
Sodium: 138 (ref 137–147)

## 2021-08-20 LAB — CBC AND DIFFERENTIAL
HCT: 41 (ref 41–53)
Hemoglobin: 13.6 (ref 13.5–17.5)
Platelets: 159 10*3/uL (ref 150–400)
WBC: 4.6

## 2021-08-20 LAB — CBC: RBC: 4.21 (ref 3.87–5.11)

## 2021-08-20 LAB — TSH: TSH: 5.93 — AB (ref 0.41–5.90)

## 2021-08-31 ENCOUNTER — Other Ambulatory Visit: Payer: Self-pay | Admitting: Internal Medicine

## 2021-08-31 DIAGNOSIS — N184 Chronic kidney disease, stage 4 (severe): Secondary | ICD-10-CM

## 2021-09-10 DIAGNOSIS — H353211 Exudative age-related macular degeneration, right eye, with active choroidal neovascularization: Secondary | ICD-10-CM | POA: Diagnosis not present

## 2021-09-10 DIAGNOSIS — H353124 Nonexudative age-related macular degeneration, left eye, advanced atrophic with subfoveal involvement: Secondary | ICD-10-CM | POA: Diagnosis not present

## 2021-09-11 ENCOUNTER — Other Ambulatory Visit: Payer: Self-pay | Admitting: Internal Medicine

## 2021-09-28 ENCOUNTER — Non-Acute Institutional Stay: Payer: Medicare PPO | Admitting: Adult Health

## 2021-09-28 ENCOUNTER — Encounter: Payer: Self-pay | Admitting: Adult Health

## 2021-09-28 VITALS — BP 131/71 | HR 76 | Temp 97.5°F | Ht 77.0 in | Wt 188.2 lb

## 2021-09-28 DIAGNOSIS — N184 Chronic kidney disease, stage 4 (severe): Secondary | ICD-10-CM

## 2021-09-28 DIAGNOSIS — I10 Essential (primary) hypertension: Secondary | ICD-10-CM

## 2021-09-28 DIAGNOSIS — I700 Atherosclerosis of aorta: Secondary | ICD-10-CM | POA: Insufficient documentation

## 2021-09-28 DIAGNOSIS — R350 Frequency of micturition: Secondary | ICD-10-CM

## 2021-09-28 DIAGNOSIS — E7800 Pure hypercholesterolemia, unspecified: Secondary | ICD-10-CM

## 2021-09-28 DIAGNOSIS — I82402 Acute embolism and thrombosis of unspecified deep veins of left lower extremity: Secondary | ICD-10-CM | POA: Diagnosis not present

## 2021-09-28 DIAGNOSIS — R2689 Other abnormalities of gait and mobility: Secondary | ICD-10-CM

## 2021-09-28 DIAGNOSIS — H3530 Unspecified macular degeneration: Secondary | ICD-10-CM

## 2021-09-28 DIAGNOSIS — K5901 Slow transit constipation: Secondary | ICD-10-CM

## 2021-09-28 DIAGNOSIS — H9113 Presbycusis, bilateral: Secondary | ICD-10-CM

## 2021-09-28 DIAGNOSIS — E039 Hypothyroidism, unspecified: Secondary | ICD-10-CM

## 2021-09-28 NOTE — Patient Instructions (Addendum)
Please call and make an apt right away   Patient's referral sent to Dr. Carolin Sicks, [facility] at [facility] they will contact patient with appointment.   [place], [place] [address]   [phone]  Do not take ibuprofen, aleve, or other nsaids. Use Tylenol for pain.

## 2021-09-28 NOTE — Progress Notes (Signed)
Location:  [facility]  POS: Clinic  Provider: Royal Hawthorn, ANP  Code Status:  Goals of Care:     06/30/2021    4:38 PM  Advanced Directives  Does Patient Have a Medical Advance Directive? Yes  Type of Advance Directive Out of facility DNR (pink MOST or yellow form);Living will;Healthcare Power of Attorney  Does patient want to make changes to medical advance directive? No - Patient declined  Copy of [place] in Chart? Yes - validated most recent copy scanned in chart (See row information)  Pre-existing out of facility DNR order (yellow form or pink MOST form) Yellow form placed in chart (order not valid for inpatient use)     Chief Complaint  Patient presents with   Medical Management of Chronic Issues    Medical management of Chronic issues. 3 Month follow up. Complains of Back pain and Frequent Urination.     HPI: Patient is a 88 y.o. male seen today for medical management of chronic diseases.    He reports back pain low on both sides and frequency possibly a few weeks. No fever or chills. Appetite is good. No blood in urine. Does not feel like he completely empties. No burning. Has not seen urology in 68 years which he saw for a UTI.    CR worsening, referral was placed last month to [facility] but he is not sure why he does not have an apt yet.  Lab Results  Component Value Date   BUN 39 (A) 08/20/2021   Lab Results  Component Value Date   CREATININE 2.7 (A) 08/20/2021   CT of the pelvis for right hip pain on 04/22/21 showed no acute fracture, with enlarged prostate.   He exercises three times a week attends class was on hold after fall but resumed a couple of weeks ago.   Continues on xarelto for recurrent DVT, has an IVC filter   Lives with his wife. Retired Forensic psychologist.   Hx of HTN  Hypothyroidism Lab Results  Component Value Date   TSH 5.93 (A) 08/20/2021     Has constipation sometimes and uses miralax prn  Sleeping well.  Denies depression.   Has macular degeneration and gets eye injections. Vision stable per his account the past 3-4 years.   Wears hearing aids and seen audiology   Past Medical History:  Diagnosis Date   Cataract 2008   left and right eyde By Dr. Jodi Mourning    DVT (deep venous thrombosis) ([place])    Exudative age-related macular degeneration of right eye with active choroidal neovascularization ([place])    Hypercholesterolemia    Hypertension    Hypothyroid    Kidney stone    Vitreous detachment, bilateral 03/15/2019    Past Surgical History:  Procedure Laterality Date   CATARACT EXTRACTION W/ INTRAOCULAR LENS IMPLANT Bilateral    COLONOSCOPY     Dr. Alvia Grove, prior polyps, but last due to age   HEMORROIDECTOMY  1984   Dr. Sherilyn Dacosta repair of right leg  Right    TOTAL HIP ARTHROPLASTY      Allergies  Allergen Reactions   Neosporin [Neomycin-Bacitracin Zn-Polymyx] Dermatitis   Penicillins Other (See Comments)    Reaction not recalled, but the "reaction was bad"    Outpatient Encounter Medications as of 09/28/2021  Medication Sig   acetaminophen (TYLENOL) 325 MG tablet Take 325-650 mg by mouth every 6 (six) hours as needed for mild pain or headache.   atorvastatin (LIPITOR) 20  MG tablet TAKE 1 TABLET BY MOUTH EACH NIGHT AT BEDTIME   levothyroxine (SYNTHROID) 100 MCG tablet TAKE 1 TABLET BY MOUTH EVERY DAY BEFORE BREAKFAST (Patient taking differently: Take 100 mcg by mouth daily before breakfast.)   lisinopril (ZESTRIL) 10 MG tablet TAKE 1 TABLET BY MOUTH EVERY DAY (Patient taking differently: Take 10 mg by mouth daily.)   methocarbamol (ROBAXIN) 500 MG tablet Take 1 tablet (500 mg total) by mouth every 8 (eight) hours as needed for muscle spasms.   Multiple Vitamins-Minerals (PRESERVISION AREDS PO) Take 1 capsule by mouth 2 (two) times a day.   sildenafil (VIAGRA) 100 MG tablet Take 1 tablet (100 mg total) by mouth daily as needed for erectile dysfunction.   XARELTO 20 MG TABS  tablet TAKE 1 TABLET BY MOUTH EVERY DAY WITH EVENING MEAL   [DISCONTINUED] naproxen sodium (ALEVE) 220 MG tablet Take 220 mg by mouth daily as needed (for headaches or pain).   No facility-administered encounter medications on file as of 09/28/2021.    Review of Systems:  Review of Systems  Health Maintenance  Topic Date Due   TETANUS/TDAP  Never done   Zoster Vaccines- Shingrix (1 of 2) Never done   INFLUENZA VACCINE  09/08/2021   COVID-19 Vaccine (4 - Moderna series) 01/07/2022 (Originally 01/16/2021)   Pneumonia Vaccine 70+ Years old  Completed   HPV VACCINES  Aged Out    Physical Exam: Vitals:   09/28/21 1345  BP: 131/71  Pulse: 76  Temp: (!) 97.5 F (36.4 C)  SpO2: 98%  Weight: 188 lb 3.2 oz (85.4 kg)  Height: 6\' 5"  (1.956 m)   Body mass index is 22.32 kg/m. Physical Exam Constitutional:      General: He is not in acute distress.    Appearance: He is not diaphoretic.  HENT:     Head: Normocephalic and atraumatic.     Nose: Nose normal.     Mouth/Throat:     Mouth: Mucous membranes are moist.     Pharynx: Oropharynx is clear.  Eyes:     Conjunctiva/sclera: Conjunctivae normal.     Pupils: Pupils are equal, round, and reactive to light.  Neck:     Thyroid: No thyromegaly.     Vascular: No JVD.     Trachea: No tracheal deviation.  Cardiovascular:     Rate and Rhythm: Normal rate and regular rhythm.     Heart sounds: No murmur heard. Pulmonary:     Effort: Pulmonary effort is normal. No respiratory distress.     Breath sounds: Normal breath sounds. No wheezing.  Abdominal:     General: Bowel sounds are normal. There is no distension.     Palpations: Abdomen is soft.     Tenderness: There is no abdominal tenderness. There is no right CVA tenderness, left CVA tenderness or guarding.  Musculoskeletal:     Right lower leg: No edema.     Left lower leg: No edema.  Lymphadenopathy:     Cervical: No cervical adenopathy.  Skin:    General: Skin is warm and dry.   Neurological:     Mental Status: He is alert and oriented to person, place, and time.     Cranial Nerves: No cranial nerve deficit.  Psychiatric:        Mood and Affect: Mood normal.     Labs reviewed: Basic Metabolic Panel: Recent Labs    06/23/21 0000 08/20/21 0000  NA 146 138  K 4.8 5.0  CL 111* 105  CO2 18 19  BUN 40* 39*  CREATININE 2.8* 2.7*  CALCIUM 8.8 8.6*  TSH 7.42* 5.93*   Liver Function Tests: No results for input(s): "AST", "ALT", "ALKPHOS", "BILITOT", "PROT", "ALBUMIN" in the last 8760 hours. No results for input(s): "LIPASE", "AMYLASE" in the last 8760 hours. No results for input(s): "AMMONIA" in the last 8760 hours. CBC: Recent Labs    06/23/21 0000 08/20/21 0000  WBC 4.5 4.6  HGB 13.7 13.6  HCT 40* 41  PLT 156 159   Lipid Panel: No results for input(s): "CHOL", "HDL", "LDLCALC", "TRIG", "CHOLHDL", "LDLDIRECT" in the last 8760 hours. No results found for: "HGBA1C"  Procedures since last visit: No results found.  Assessment/Plan  1. Frequency of urination Check UA C and S Wait on results, may need urology referral  2. Chronic kidney disease (CKD), stage IV (severe) (HCC) Bladder is not distended Recommend renal diet, education provided Avoid nsaids Needs apt with nephrology referral has already been placed. Asked him to call them directly.   3. Essential hypertension Controlled  4. DVT, lower extremity, recurrent, left ([place]) Current on xarelto May need to change due to worsening renal function. Will have him call nephrology asap   5. Slow transit constipation Miralax prn  6. Acquired hypothyroidism Lab Results  Component Value Date   TSH 5.93 (A) 08/20/2021     7. Presbycusis of both ears Wears hearing aides.   8. Pure hypercholesterolemia Lab Results  Component Value Date   LDLCALC 126 07/08/2020  On lipitor    9. Macular degeneration, unspecified laterality, unspecified type Followed by ophthalmology   10.  Balance problem Uses cane   11. Aortic atherosclerosis Fount on CT  On statin BP controlled  Labs/tests ordered:  * No order type specified * CMP CBC Lipid TSH Next appt: 2 months with Dr Lyndel Safe.    Total time 10min:  time greater than 50% of total time spent doing pt counseling and coordination of care

## 2021-10-01 ENCOUNTER — Telehealth: Payer: Self-pay | Admitting: Adult Health

## 2021-10-01 NOTE — Telephone Encounter (Signed)
Urine culture returned with no growth. Tried to call [name] but there was no answer.  Discussed with clinic nurse Nira Conn  if patient is still having symptoms of frequency or difficulty emptying to have him void and check PVR with a bladder scanner.

## 2021-10-13 DIAGNOSIS — H6123 Impacted cerumen, bilateral: Secondary | ICD-10-CM | POA: Diagnosis not present

## 2021-10-22 DIAGNOSIS — H353124 Nonexudative age-related macular degeneration, left eye, advanced atrophic with subfoveal involvement: Secondary | ICD-10-CM | POA: Diagnosis not present

## 2021-10-22 DIAGNOSIS — H353211 Exudative age-related macular degeneration, right eye, with active choroidal neovascularization: Secondary | ICD-10-CM | POA: Diagnosis not present

## 2021-10-22 DIAGNOSIS — H35371 Puckering of macula, right eye: Secondary | ICD-10-CM | POA: Diagnosis not present

## 2021-10-22 DIAGNOSIS — H43813 Vitreous degeneration, bilateral: Secondary | ICD-10-CM | POA: Diagnosis not present

## 2021-10-23 ENCOUNTER — Other Ambulatory Visit: Payer: Self-pay | Admitting: Internal Medicine

## 2021-11-04 DIAGNOSIS — I82403 Acute embolism and thrombosis of unspecified deep veins of lower extremity, bilateral: Secondary | ICD-10-CM | POA: Diagnosis not present

## 2021-11-04 DIAGNOSIS — I129 Hypertensive chronic kidney disease with stage 1 through stage 4 chronic kidney disease, or unspecified chronic kidney disease: Secondary | ICD-10-CM | POA: Diagnosis not present

## 2021-11-04 DIAGNOSIS — E785 Hyperlipidemia, unspecified: Secondary | ICD-10-CM | POA: Diagnosis not present

## 2021-11-04 DIAGNOSIS — N184 Chronic kidney disease, stage 4 (severe): Secondary | ICD-10-CM | POA: Diagnosis not present

## 2021-11-04 DIAGNOSIS — E039 Hypothyroidism, unspecified: Secondary | ICD-10-CM | POA: Diagnosis not present

## 2021-11-05 ENCOUNTER — Other Ambulatory Visit: Payer: Self-pay | Admitting: Internal Medicine

## 2021-11-05 DIAGNOSIS — N184 Chronic kidney disease, stage 4 (severe): Secondary | ICD-10-CM

## 2021-11-05 DIAGNOSIS — I129 Hypertensive chronic kidney disease with stage 1 through stage 4 chronic kidney disease, or unspecified chronic kidney disease: Secondary | ICD-10-CM

## 2021-11-09 ENCOUNTER — Ambulatory Visit
Admission: RE | Admit: 2021-11-09 | Discharge: 2021-11-09 | Disposition: A | Discharge status: Home / Self Care | Payer: Medicare PPO | Source: Ambulatory Visit | Attending: Internal Medicine | Admitting: Internal Medicine

## 2021-11-09 DIAGNOSIS — N189 Chronic kidney disease, unspecified: Secondary | ICD-10-CM | POA: Diagnosis not present

## 2021-11-09 DIAGNOSIS — N400 Enlarged prostate without lower urinary tract symptoms: Secondary | ICD-10-CM | POA: Diagnosis not present

## 2021-11-09 DIAGNOSIS — N184 Chronic kidney disease, stage 4 (severe): Secondary | ICD-10-CM

## 2021-11-09 DIAGNOSIS — N281 Cyst of kidney, acquired: Secondary | ICD-10-CM | POA: Diagnosis not present

## 2021-11-09 DIAGNOSIS — I129 Hypertensive chronic kidney disease with stage 1 through stage 4 chronic kidney disease, or unspecified chronic kidney disease: Secondary | ICD-10-CM

## 2021-11-10 DIAGNOSIS — H6123 Impacted cerumen, bilateral: Secondary | ICD-10-CM | POA: Diagnosis not present

## 2021-11-12 ENCOUNTER — Other Ambulatory Visit: Payer: Self-pay | Admitting: Internal Medicine

## 2021-11-17 DIAGNOSIS — N184 Chronic kidney disease, stage 4 (severe): Secondary | ICD-10-CM | POA: Diagnosis not present

## 2021-11-18 DIAGNOSIS — Z08 Encounter for follow-up examination after completed treatment for malignant neoplasm: Secondary | ICD-10-CM | POA: Diagnosis not present

## 2021-11-18 DIAGNOSIS — L578 Other skin changes due to chronic exposure to nonionizing radiation: Secondary | ICD-10-CM | POA: Diagnosis not present

## 2021-11-18 DIAGNOSIS — C44729 Squamous cell carcinoma of skin of left lower limb, including hip: Secondary | ICD-10-CM | POA: Diagnosis not present

## 2021-11-18 DIAGNOSIS — L821 Other seborrheic keratosis: Secondary | ICD-10-CM | POA: Diagnosis not present

## 2021-11-18 DIAGNOSIS — Z85828 Personal history of other malignant neoplasm of skin: Secondary | ICD-10-CM | POA: Diagnosis not present

## 2021-11-18 DIAGNOSIS — Z85820 Personal history of malignant melanoma of skin: Secondary | ICD-10-CM | POA: Diagnosis not present

## 2021-11-18 DIAGNOSIS — D485 Neoplasm of uncertain behavior of skin: Secondary | ICD-10-CM | POA: Diagnosis not present

## 2021-11-18 DIAGNOSIS — D1801 Hemangioma of skin and subcutaneous tissue: Secondary | ICD-10-CM | POA: Diagnosis not present

## 2021-11-24 DIAGNOSIS — E7800 Pure hypercholesterolemia, unspecified: Secondary | ICD-10-CM | POA: Diagnosis not present

## 2021-11-24 LAB — COMPREHENSIVE METABOLIC PANEL
Albumin: 4.2 (ref 3.5–5.0)
Calcium: 9.2 (ref 8.7–10.7)
Globulin: 2.3

## 2021-11-24 LAB — TSH: TSH: 0.93 (ref 0.41–5.90)

## 2021-11-24 LAB — HEPATIC FUNCTION PANEL
ALT: 8 U/L — AB (ref 10–40)
AST: 18 (ref 14–40)
Alkaline Phosphatase: 113 (ref 25–125)

## 2021-11-24 LAB — BASIC METABOLIC PANEL
BUN: 46 — AB (ref 4–21)
CO2: 18 (ref 13–22)
Chloride: 108 (ref 99–108)
Creatinine: 3 — AB (ref 0.6–1.3)
Glucose: 94
Potassium: 5 mEq/L (ref 3.5–5.1)
Sodium: 140 (ref 137–147)

## 2021-11-24 LAB — LIPID PANEL
Cholesterol: 271 — AB (ref 0–200)
HDL: 46 (ref 35–70)
LDL Cholesterol: 194
Triglycerides: 159 (ref 40–160)

## 2021-12-01 ENCOUNTER — Encounter: Payer: Self-pay | Admitting: Internal Medicine

## 2021-12-01 ENCOUNTER — Non-Acute Institutional Stay: Payer: Medicare PPO | Admitting: Internal Medicine

## 2021-12-01 VITALS — BP 128/64 | HR 74 | Temp 97.8°F | Resp 18 | Ht 77.0 in | Wt 184.0 lb

## 2021-12-01 DIAGNOSIS — N529 Male erectile dysfunction, unspecified: Secondary | ICD-10-CM

## 2021-12-01 DIAGNOSIS — E039 Hypothyroidism, unspecified: Secondary | ICD-10-CM | POA: Diagnosis not present

## 2021-12-01 DIAGNOSIS — I82402 Acute embolism and thrombosis of unspecified deep veins of left lower extremity: Secondary | ICD-10-CM | POA: Diagnosis not present

## 2021-12-01 DIAGNOSIS — N184 Chronic kidney disease, stage 4 (severe): Secondary | ICD-10-CM

## 2021-12-01 DIAGNOSIS — I10 Essential (primary) hypertension: Secondary | ICD-10-CM | POA: Diagnosis not present

## 2021-12-01 DIAGNOSIS — E7800 Pure hypercholesterolemia, unspecified: Secondary | ICD-10-CM

## 2021-12-01 MED ORDER — RIVAROXABAN 10 MG PO TABS: 10.0000 mg | ORAL_TABLET | Freq: Every day | ORAL | 3 refills | Status: DC

## 2021-12-01 MED ORDER — ATORVASTATIN CALCIUM 40 MG PO TABS: 40.0000 mg | ORAL_TABLET | Freq: Every day | ORAL | 3 refills | Status: DC

## 2021-12-01 NOTE — Patient Instructions (Signed)
Do not take Amlodipine  I am increasing your Lipitor Decreasing your xarelto

## 2021-12-02 ENCOUNTER — Encounter: Payer: Medicare PPO | Admitting: Internal Medicine

## 2021-12-02 DIAGNOSIS — L905 Scar conditions and fibrosis of skin: Secondary | ICD-10-CM | POA: Diagnosis not present

## 2021-12-02 DIAGNOSIS — C44729 Squamous cell carcinoma of skin of left lower limb, including hip: Secondary | ICD-10-CM | POA: Diagnosis not present

## 2021-12-05 NOTE — Progress Notes (Unsigned)
Location:  [place] of Service:  Clinic (12)  Provider:   Code Status:  Goals of Care:     12/01/2021   10:07 AM  Advanced Directives  Does Patient Have a Medical Advance Directive? Yes  Type of Paramedic of [place];Living will;Out of facility DNR (pink MOST or yellow form)  Copy of [place] in Chart? Yes - validated most recent copy scanned in chart (See row information)     Chief Complaint  Patient presents with   Medical Management of Chronic Issues    2 month follow up   Immunizations    Discussed the need for shingles, tetanus and flu    HPI: Patient is a 88 y.o. male seen today for medical management of chronic diseases.    Lives in [place] in [place] with his wife  Has history of macular degeneration.  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 Jodi Mourning    DVT (deep venous thrombosis) ([place])    Exudative age-related macular degeneration of right eye with active choroidal neovascularization ([place])    Hypercholesterolemia    Hypertension    Hypothyroid    Kidney stone    Vitreous detachment, bilateral 03/15/2019    Past Surgical History:  Procedure Laterality Date   CATARACT EXTRACTION W/ INTRAOCULAR LENS IMPLANT Bilateral    COLONOSCOPY     Dr. Alvia Grove, prior polyps, but last due to age   HEMORROIDECTOMY  33   Dr. Sherilyn Dacosta repair of right leg  Right    TOTAL HIP ARTHROPLASTY      Allergies  Allergen Reactions   Neosporin  [Neomycin-Bacitracin Zn-Polymyx] Dermatitis   Penicillins Other (See Comments)    Reaction not recalled, but the "reaction was bad"    Outpatient Encounter Medications as of 12/01/2021  Medication Sig   acetaminophen (TYLENOL) 325 MG tablet Take 325-650 mg by mouth every 6 (six) hours as needed for mild pain or headache.   atorvastatin (LIPITOR) 40 MG tablet Take 1 tablet (40 mg total) by mouth daily.   levothyroxine (SYNTHROID) 100 MCG tablet TAKE 1 TABLET BY MOUTH EVERY DAY BEFORE BREAKFAST (Patient taking differently: Take 100 mcg by mouth daily before breakfast.)   lisinopril (ZESTRIL) 10 MG tablet TAKE 1 TABLET BY MOUTH EVERY DAY   methocarbamol (ROBAXIN) 500 MG tablet Take 1 tablet (500 mg total) by mouth every 8 (eight) hours as needed for muscle spasms.   Multiple Vitamins-Minerals (PRESERVISION AREDS PO) Take 1 capsule by mouth 2 (two) times a day.   rivaroxaban (XARELTO) 10 MG TABS tablet Take 1 tablet (10 mg total) by mouth daily.   [DISCONTINUED] atorvastatin (LIPITOR) 20 MG tablet TAKE 1 TABLET BY MOUTH EACH NIGHT AT BEDTIME   [DISCONTINUED] sildenafil (VIAGRA) 100 MG tablet Take 1 tablet (100 mg total) by mouth daily as needed for erectile dysfunction.   [DISCONTINUED] XARELTO 20 MG TABS tablet TAKE 1 TABLET BY MOUTH EVERY DAY WITH EVENING MEAL   No  facility-administered encounter medications on file as of 12/01/2021.    Review of Systems:  Review of Systems  Constitutional:  Negative for activity change, appetite change and unexpected weight change.  HENT: Negative.    Respiratory:  Negative for cough and shortness of breath.   Cardiovascular:  Negative for leg swelling.  Gastrointestinal:  Negative for constipation.  Genitourinary:  Positive for frequency.  Musculoskeletal:  Negative for arthralgias, gait problem and myalgias.  Skin: Negative.  Negative for rash.  Neurological:  Negative for dizziness and weakness.  Psychiatric/Behavioral:  Negative for confusion and  sleep disturbance.   All other systems reviewed and are negative.   Health Maintenance  Topic Date Due   TETANUS/TDAP  Never done   Zoster Vaccines- Shingrix (1 of 2) Never done   Medicare Annual Wellness (AWV)  12/06/2020   INFLUENZA VACCINE  09/08/2021   COVID-19 Vaccine (4 - Moderna series) 01/07/2022 (Originally 01/16/2021)   Pneumonia Vaccine 69+ Years old  Completed   HPV VACCINES  Aged Out    Physical Exam: Vitals:   12/01/21 1004  Pulse: 74  Resp: 18  Temp: 97.8 F (36.6 C)  TempSrc: Temporal  SpO2: 96%  Weight: 184 lb (83.5 kg)  Height: 6\' 5"  (1.956 m)   Body mass index is 21.82 kg/m. Physical Exam Vitals reviewed.  Constitutional:      Appearance: Normal appearance.  HENT:     Head: Normocephalic.     Nose: Nose normal.     Mouth/Throat:     Mouth: Mucous membranes are moist.     Pharynx: Oropharynx is clear.  Eyes:     Pupils: Pupils are equal, round, and reactive to light.  Cardiovascular:     Rate and Rhythm: Normal rate and regular rhythm.     Pulses: Normal pulses.     Heart sounds: No murmur heard. Pulmonary:     Effort: Pulmonary effort is normal. No respiratory distress.     Breath sounds: Normal breath sounds. No rales.  Abdominal:     General: Abdomen is flat. Bowel sounds are normal.     Palpations: Abdomen is soft.  Musculoskeletal:        General: No swelling.     Cervical back: Neck supple.  Skin:    General: Skin is warm.  Neurological:     General: No focal deficit present.     Mental Status: He is alert and oriented to person, place, and time.  Psychiatric:        Mood and Affect: Mood normal.        Thought Content: Thought content normal.     Labs reviewed: Basic Metabolic Panel: Recent Labs    06/23/21 0000 08/20/21 0000 11/24/21 0000  NA 146 138 140  K 4.8 5.0 5.0  CL 111* 105 108  CO2 18 19 18   BUN 40* 39* 46*  CREATININE 2.8* 2.7* 3.0*  CALCIUM 8.8 8.6* 9.2  TSH 7.42* 5.93* 0.93   Liver Function  Tests: Recent Labs    11/24/21 0000  AST 18  ALT 8*  ALKPHOS 113  ALBUMIN 4.2   No results for input(s): "LIPASE", "AMYLASE" in the last 8760 hours. No results for input(s): "AMMONIA" in the last 8760 hours. CBC: Recent Labs    06/23/21 0000 08/20/21 0000  WBC 4.5 4.6  HGB 13.7 13.6  HCT 40* 41  PLT 156 159   Lipid Panel: Recent Labs    11/24/21 0000  CHOL 271*  HDL 46  LDLCALC  194  TRIG 159   No results found for: "HGBA1C"  Procedures since last visit: US RENAL  Result Date: 11/10/2021 CLINICAL DATA:  Chronic kidney disease EXAM: RENAL / URINARY TRACT ULTRASOUND COMPLETE COMPARISON:  None Available. FINDINGS: Right Kidney: Renal measurements: 11.3 x 5.1 x 5.7 cm = volume: 171 mL. Cortical thinning. Increased cortical echogenicity. Two complicated cysts are identified in the right kidney with the largest measuring 2 cm. No follow-up imaging recommended for the cysts. Left Kidney: Renal measurements: 9.4 x 5.1 x 4.5 cm = volume: 112 mL. Increased cortical echogenicity. Multiple complicated cysts are associated with the left kidney with the largest measuring 2.1 cm off the upper pole. No follow-up imaging recommended for the cysts. Bladder: Appears normal for degree of bladder distention. Other: The prostate is enlarged measuring 5.2 x 4.9 x 6.1 cm. IMPRESSION: 1. Medical renal disease with increased cortical echogenicity bilaterally. 2. The bladder is unremarkable. 3. The prostate is enlarged measuring 5.2 x 4.9 x 6.1 cm. Electronically Signed   By: Dorise Bullion III M.D.   On: 11/10/2021 14:09    Assessment/Plan 1. Chronic kidney disease (CKD), stage IV (severe) ([place]) Now establish with renal Again reminded him to not take any NSAIDS  2. Essential hypertension Doing well on Lisinopril Not taking Norvasc  3. DVT, lower extremity, recurrent, left (HCC) Decrease the dose of xarelto to 10 mg due to renal disease  Xarelto not indicated but will not change right now  4.  Acquired hypothyroidism Tsh Normal  5. Pure hypercholesterolemia LDL very high Change atorvastatin to 40 mg I am not sure if he is Compliant   6. Erectile dysfunction, unspecified erectile dysfunction type     Labs/tests ordered:  * No order type specified * Next appt:  03/08/2022

## 2021-12-07 ENCOUNTER — Encounter: Payer: Self-pay | Admitting: Internal Medicine

## 2021-12-16 DIAGNOSIS — L089 Local infection of the skin and subcutaneous tissue, unspecified: Secondary | ICD-10-CM | POA: Diagnosis not present

## 2021-12-27 ENCOUNTER — Other Ambulatory Visit: Payer: Self-pay | Admitting: Internal Medicine

## 2021-12-30 DIAGNOSIS — L089 Local infection of the skin and subcutaneous tissue, unspecified: Secondary | ICD-10-CM | POA: Diagnosis not present

## 2021-12-30 DIAGNOSIS — S81802A Unspecified open wound, left lower leg, initial encounter: Secondary | ICD-10-CM | POA: Diagnosis not present

## 2022-01-21 DIAGNOSIS — R3911 Hesitancy of micturition: Secondary | ICD-10-CM | POA: Diagnosis not present

## 2022-01-21 DIAGNOSIS — E875 Hyperkalemia: Secondary | ICD-10-CM | POA: Diagnosis not present

## 2022-01-21 DIAGNOSIS — N184 Chronic kidney disease, stage 4 (severe): Secondary | ICD-10-CM | POA: Diagnosis not present

## 2022-01-21 DIAGNOSIS — I129 Hypertensive chronic kidney disease with stage 1 through stage 4 chronic kidney disease, or unspecified chronic kidney disease: Secondary | ICD-10-CM | POA: Diagnosis not present

## 2022-01-21 DIAGNOSIS — H353211 Exudative age-related macular degeneration, right eye, with active choroidal neovascularization: Secondary | ICD-10-CM | POA: Diagnosis not present

## 2022-01-21 LAB — PROTEIN / CREATININE RATIO, URINE
Albumin, U: 38.6
Creatinine, Urine: 91.3

## 2022-01-21 LAB — COMPREHENSIVE METABOLIC PANEL
Albumin: 4.1 (ref 3.5–5.0)
Calcium: 8.8 (ref 8.7–10.7)
eGFR: 17

## 2022-01-21 LAB — BASIC METABOLIC PANEL
BUN: 45 — AB (ref 4–21)
CO2: 21 (ref 13–22)
Chloride: 109 — AB (ref 99–108)
Creatinine: 3.2 — AB (ref 0.6–1.3)
Glucose: 99
Potassium: 5.4 mEq/L — AB (ref 3.5–5.1)
Sodium: 140 (ref 137–147)

## 2022-01-27 DIAGNOSIS — L089 Local infection of the skin and subcutaneous tissue, unspecified: Secondary | ICD-10-CM | POA: Diagnosis not present

## 2022-02-02 ENCOUNTER — Encounter: Payer: Self-pay | Admitting: Internal Medicine

## 2022-02-23 ENCOUNTER — Other Ambulatory Visit: Payer: Self-pay | Admitting: Internal Medicine

## 2022-02-23 DIAGNOSIS — Z85820 Personal history of malignant melanoma of skin: Secondary | ICD-10-CM | POA: Diagnosis not present

## 2022-02-23 DIAGNOSIS — L821 Other seborrheic keratosis: Secondary | ICD-10-CM | POA: Diagnosis not present

## 2022-02-23 DIAGNOSIS — Z08 Encounter for follow-up examination after completed treatment for malignant neoplasm: Secondary | ICD-10-CM | POA: Diagnosis not present

## 2022-02-23 DIAGNOSIS — L578 Other skin changes due to chronic exposure to nonionizing radiation: Secondary | ICD-10-CM | POA: Diagnosis not present

## 2022-02-23 DIAGNOSIS — Z85828 Personal history of other malignant neoplasm of skin: Secondary | ICD-10-CM | POA: Diagnosis not present

## 2022-02-23 DIAGNOSIS — S61401A Unspecified open wound of right hand, initial encounter: Secondary | ICD-10-CM | POA: Diagnosis not present

## 2022-02-23 DIAGNOSIS — D1801 Hemangioma of skin and subcutaneous tissue: Secondary | ICD-10-CM | POA: Diagnosis not present

## 2022-03-04 DIAGNOSIS — H43813 Vitreous degeneration, bilateral: Secondary | ICD-10-CM | POA: Diagnosis not present

## 2022-03-04 DIAGNOSIS — H33322 Round hole, left eye: Secondary | ICD-10-CM | POA: Diagnosis not present

## 2022-03-04 DIAGNOSIS — H353124 Nonexudative age-related macular degeneration, left eye, advanced atrophic with subfoveal involvement: Secondary | ICD-10-CM | POA: Diagnosis not present

## 2022-03-04 DIAGNOSIS — H43393 Other vitreous opacities, bilateral: Secondary | ICD-10-CM | POA: Diagnosis not present

## 2022-03-04 DIAGNOSIS — H353211 Exudative age-related macular degeneration, right eye, with active choroidal neovascularization: Secondary | ICD-10-CM | POA: Diagnosis not present

## 2022-03-04 DIAGNOSIS — H35033 Hypertensive retinopathy, bilateral: Secondary | ICD-10-CM | POA: Diagnosis not present

## 2022-03-04 DIAGNOSIS — H35371 Puckering of macula, right eye: Secondary | ICD-10-CM | POA: Diagnosis not present

## 2022-03-07 NOTE — Progress Notes (Deleted)
Location:  [facility]  POS: Clinic  Provider: Royal Hawthorn, ANP  Code Status:  Goals of Care:     12/01/2021   10:07 AM  Advanced Directives  Does Patient Have a Medical Advance Directive? Yes  Type of Paramedic of [place];Living will;Out of facility DNR (pink MOST or yellow form)  Copy of [place] in Chart? Yes - validated most recent copy scanned in chart (See row information)     No chief complaint on file.   HPI: Patient is a 89 y.o. male seen today for medical management of chronic diseases.     Followed by renal, had renal US showing medical renal disease, mild proteniuria, and arterionephrosclerosis. Was on NSAIDS which contributed to elevated Cr and K. Lab Results  Component Value Date   BUN 45 (A) 01/21/2022   Lab Results  Component Value Date   CREATININE 3.2 (A) 01/21/2022   He exercises three times a week attends class was on hold after fall but resumed a couple of weeks ago.   Continues on xarelto for recurrent DVT, has an IVC filter   Lives with his wife. Retired Forensic psychologist.   Hx of HTN  Hypothyroidism Lab Results  Component Value Date   TSH 0.93 11/24/2021     Has constipation sometimes and uses miralax prn  Sleeping well. Denies depression.   Has macular degeneration and gets eye injections. Vision stable per his account the past 3-4 years.   Wears hearing aids and seen audiology   Past Medical History:  Diagnosis Date   Cataract 2008   left and right eyde By Dr. Jodi Mourning    DVT (deep venous thrombosis) ([place])    Exudative age-related macular degeneration of right eye with active choroidal neovascularization ([place])    Hypercholesterolemia    Hypertension    Hypothyroid    Kidney stone    Vitreous detachment, bilateral 03/15/2019    Past Surgical History:  Procedure Laterality Date   CATARACT EXTRACTION W/ INTRAOCULAR LENS IMPLANT Bilateral    COLONOSCOPY     Dr. Alvia Grove, prior  polyps, but last due to age   HEMORROIDECTOMY  1984   Dr. Sherilyn Dacosta repair of right leg  Right    TOTAL HIP ARTHROPLASTY      Allergies  Allergen Reactions   Neosporin [Neomycin-Bacitracin Zn-Polymyx] Dermatitis   Penicillins Other (See Comments)    Reaction not recalled, but the "reaction was bad"    Outpatient Encounter Medications as of 03/08/2022  Medication Sig   acetaminophen (TYLENOL) 325 MG tablet Take 325-650 mg by mouth every 6 (six) hours as needed for mild pain or headache.   atorvastatin (LIPITOR) 40 MG tablet Take 1 tablet (40 mg total) by mouth daily.   levothyroxine (SYNTHROID) 100 MCG tablet TAKE 1 TABLET BY MOUTH EVERY DAY BEFORE BREAKFAST   lisinopril (ZESTRIL) 10 MG tablet TAKE 1 TABLET BY MOUTH EVERY DAY   methocarbamol (ROBAXIN) 500 MG tablet Take 1 tablet (500 mg total) by mouth every 8 (eight) hours as needed for muscle spasms.   Multiple Vitamins-Minerals (PRESERVISION AREDS PO) Take 1 capsule by mouth 2 (two) times a day.   rivaroxaban (XARELTO) 10 MG TABS tablet Take 1 tablet (10 mg total) by mouth daily.   [DISCONTINUED] levothyroxine (SYNTHROID) 100 MCG tablet TAKE 1 TABLET BY MOUTH EVERY DAY BEFORE BREAKFAST (Patient taking differently: Take 100 mcg by mouth daily before breakfast.)   No facility-administered encounter medications on file as of 03/08/2022.  Review of Systems:  Review of Systems  Health Maintenance  Topic Date Due   DTaP/Tdap/Td (1 - Tdap) Never done   Zoster Vaccines- Shingrix (1 of 2) Never done   Medicare Annual Wellness (AWV)  12/06/2020   INFLUENZA VACCINE  09/08/2021   COVID-19 Vaccine (6 - 2023-24 season) 10/09/2021   Pneumonia Vaccine 69+ Years old  Completed   HPV VACCINES  Aged Out    Physical Exam: There were no vitals filed for this visit.  There is no height or weight on file to calculate BMI. Physical Exam Constitutional:      General: He is not in acute distress.    Appearance: He is not diaphoretic.   HENT:     Head: Normocephalic and atraumatic.     Nose: Nose normal.     Mouth/Throat:     Mouth: Mucous membranes are moist.     Pharynx: Oropharynx is clear.  Eyes:     Conjunctiva/sclera: Conjunctivae normal.     Pupils: Pupils are equal, round, and reactive to light.  Neck:     Thyroid: No thyromegaly.     Vascular: No JVD.     Trachea: No tracheal deviation.  Cardiovascular:     Rate and Rhythm: Normal rate and regular rhythm.     Heart sounds: No murmur heard. Pulmonary:     Effort: Pulmonary effort is normal. No respiratory distress.     Breath sounds: Normal breath sounds. No wheezing.  Abdominal:     General: Bowel sounds are normal. There is no distension.     Palpations: Abdomen is soft.     Tenderness: There is no abdominal tenderness. There is no right CVA tenderness, left CVA tenderness or guarding.  Musculoskeletal:     Right lower leg: No edema.     Left lower leg: No edema.  Lymphadenopathy:     Cervical: No cervical adenopathy.  Skin:    General: Skin is warm and dry.  Neurological:     Mental Status: He is alert and oriented to person, place, and time.     Cranial Nerves: No cranial nerve deficit.  Psychiatric:        Mood and Affect: Mood normal.     Labs reviewed: Basic Metabolic Panel: Recent Labs    06/23/21 0000 08/20/21 0000 11/24/21 0000 01/21/22 0000  NA 146 138 140 140  K 4.8 5.0 5.0 5.4*  CL 111* 105 108 109*  CO2 '18 19 18 21  '$ BUN 40* 39* 46* 45*  CREATININE 2.8* 2.7* 3.0* 3.2*  CALCIUM 8.8 8.6* 9.2 8.8  TSH 7.42* 5.93* 0.93  --     Liver Function Tests: Recent Labs    11/24/21 0000 01/21/22 0000  AST 18  --   ALT 8*  --   ALKPHOS 113  --   ALBUMIN 4.2 4.1   No results for input(s): "LIPASE", "AMYLASE" in the last 8760 hours. No results for input(s): "AMMONIA" in the last 8760 hours. CBC: Recent Labs    06/23/21 0000 08/20/21 0000  WBC 4.5 4.6  HGB 13.7 13.6  HCT 40* 41  PLT 156 159    Lipid Panel: Recent  Labs    11/24/21 0000  CHOL 271*  HDL 46  LDLCALC 194  TRIG 159   No results found for: "HGBA1C"  Procedures since last visit: No results found.  Assessment/Plan   Chronic kidney disease (CKD), stage IV (severe) (HCC) Bladder is not distended Recommend renal diet, education provided Avoid nsaids  Needs apt with nephrology referral has already been placed. Asked him to call them directly.    Essential hypertension Controlled   DVT, lower extremity, recurrent, left (HCC) On reduced dose xarelto 10 mg   Slow transit constipation Miralax prn  Acquired hypothyroidism Lab Results  Component Value Date   TSH 0.93 11/24/2021     Presbycusis of both ears Wears hearing aides.   8. Pure hypercholesterolemia Lab Results  Component Value Date   LDLCALC 194 11/24/2021  On lipitor    Macular degeneration, unspecified laterality, unspecified type Followed by ophthalmology   10. Balance problem Uses cane   11. Aortic atherosclerosis Fount on CT  On statin BP controlled  Labs/tests ordered:  * No order type specified * CMP CBC Lipid TSH Next appt: 2 months with Dr Lyndel Safe.    Total time 67mn:  time greater than 50% of total time spent doing pt counseling and coordination of care

## 2022-03-08 ENCOUNTER — Encounter: Payer: Medicare PPO | Admitting: Adult Health

## 2022-03-16 ENCOUNTER — Telehealth: Payer: Self-pay

## 2022-03-16 DIAGNOSIS — H6123 Impacted cerumen, bilateral: Secondary | ICD-10-CM | POA: Diagnosis not present

## 2022-03-16 NOTE — Telephone Encounter (Signed)
Attempted to call patient to schedule an appointment for Hypertension and Kidney. Patient labs were abnormal as well.

## 2022-03-19 ENCOUNTER — Ambulatory Visit: Payer: Medicare PPO | Admitting: Student

## 2022-03-19 ENCOUNTER — Emergency Department (HOSPITAL_BASED_OUTPATIENT_CLINIC_OR_DEPARTMENT_OTHER): Payer: Medicare PPO

## 2022-03-19 ENCOUNTER — Other Ambulatory Visit (HOSPITAL_BASED_OUTPATIENT_CLINIC_OR_DEPARTMENT_OTHER): Payer: Self-pay

## 2022-03-19 ENCOUNTER — Telehealth: Payer: Self-pay

## 2022-03-19 ENCOUNTER — Emergency Department (HOSPITAL_BASED_OUTPATIENT_CLINIC_OR_DEPARTMENT_OTHER)
Admission: EM | Admit: 2022-03-19 | Discharge: 2022-03-19 | Disposition: A | Discharge status: Home / Self Care | Payer: Medicare PPO | Attending: Emergency Medicine | Admitting: Emergency Medicine

## 2022-03-19 ENCOUNTER — Other Ambulatory Visit: Payer: Self-pay

## 2022-03-19 DIAGNOSIS — K4090 Unilateral inguinal hernia, without obstruction or gangrene, not specified as recurrent: Secondary | ICD-10-CM | POA: Insufficient documentation

## 2022-03-19 DIAGNOSIS — R1031 Right lower quadrant pain: Secondary | ICD-10-CM | POA: Diagnosis present

## 2022-03-19 DIAGNOSIS — I10 Essential (primary) hypertension: Secondary | ICD-10-CM | POA: Diagnosis not present

## 2022-03-19 DIAGNOSIS — N200 Calculus of kidney: Secondary | ICD-10-CM | POA: Diagnosis not present

## 2022-03-19 DIAGNOSIS — Z79899 Other long term (current) drug therapy: Secondary | ICD-10-CM | POA: Insufficient documentation

## 2022-03-19 DIAGNOSIS — K8020 Calculus of gallbladder without cholecystitis without obstruction: Secondary | ICD-10-CM | POA: Diagnosis not present

## 2022-03-19 LAB — CBC WITH DIFFERENTIAL/PLATELET
Abs Immature Granulocytes: 0.01 10*3/uL (ref 0.00–0.07)
Basophils Absolute: 0 10*3/uL (ref 0.0–0.1)
Basophils Relative: 0 %
Eosinophils Absolute: 0.1 10*3/uL (ref 0.0–0.5)
Eosinophils Relative: 1 %
HCT: 37.7 % — ABNORMAL LOW (ref 39.0–52.0)
Hemoglobin: 12.8 g/dL — ABNORMAL LOW (ref 13.0–17.0)
Immature Granulocytes: 0 %
Lymphocytes Relative: 16 %
Lymphs Abs: 0.9 10*3/uL (ref 0.7–4.0)
MCH: 32.9 pg (ref 26.0–34.0)
MCHC: 34 g/dL (ref 30.0–36.0)
MCV: 96.9 fL (ref 80.0–100.0)
Monocytes Absolute: 0.4 10*3/uL (ref 0.1–1.0)
Monocytes Relative: 7 %
Neutro Abs: 3.9 10*3/uL (ref 1.7–7.7)
Neutrophils Relative %: 76 %
Platelets: 158 10*3/uL (ref 150–400)
RBC: 3.89 MIL/uL — ABNORMAL LOW (ref 4.22–5.81)
RDW: 13.1 % (ref 11.5–15.5)
WBC: 5.3 10*3/uL (ref 4.0–10.5)
nRBC: 0 % (ref 0.0–0.2)

## 2022-03-19 LAB — COMPREHENSIVE METABOLIC PANEL
ALT: 11 U/L (ref 0–44)
AST: 13 U/L — ABNORMAL LOW (ref 15–41)
Albumin: 4.4 g/dL (ref 3.5–5.0)
Alkaline Phosphatase: 89 U/L (ref 38–126)
Anion gap: 13 (ref 5–15)
BUN: 52 mg/dL — ABNORMAL HIGH (ref 8–23)
CO2: 19 mmol/L — ABNORMAL LOW (ref 22–32)
Calcium: 9.5 mg/dL (ref 8.9–10.3)
Chloride: 108 mmol/L (ref 98–111)
Creatinine, Ser: 3.34 mg/dL — ABNORMAL HIGH (ref 0.61–1.24)
GFR, Estimated: 16 mL/min — ABNORMAL LOW (ref >60)
Glucose, Bld: 144 mg/dL — ABNORMAL HIGH (ref 70–99)
Potassium: 4.3 mmol/L (ref 3.5–5.1)
Sodium: 140 mmol/L (ref 135–145)
Total Bilirubin: 0.5 mg/dL (ref 0.3–1.2)
Total Protein: 7.2 g/dL (ref 6.5–8.1)

## 2022-03-19 LAB — LIPASE, BLOOD: Lipase: 100 U/L — ABNORMAL HIGH (ref 11–51)

## 2022-03-19 MED ORDER — MUPIROCIN 2 % EX OINT
1.0000 | TOPICAL_OINTMENT | Freq: Two times a day (BID) | CUTANEOUS | 0 refills | Status: DC
  Filled 2022-03-19: qty 22, 11d supply, fill #0

## 2022-03-19 NOTE — ED Notes (Signed)
Unable to obtain pt's temperature due to pt eating ice cream before he came.

## 2022-03-19 NOTE — ED Provider Notes (Signed)
[facility] Provider Note   CSN: [id number] Arrival date & time: 03/19/22  1239     History  Chief Complaint  Patient presents with   Abdominal Pain    Stanley Brooks is a 89 y.o. male.  Patient here with pain in his right lower abdomen.  He has noticed a bulge over the last few days.  Nothing makes it worse or better.  History of pancreatitis and kidney stones, history of DVT, high cholesterol hypertension.  He denies any nausea vomiting diarrhea.  No pain urination.  Nothing makes it worse or better.  The history is provided by the patient.       Home Medications Prior to Admission medications   Medication Sig Start Date End Date Taking? Authorizing Provider  mupirocin cream (BACTROBAN) 2 % Apply 1 Application topically 2 (two) times daily. To bellybutton area 03/19/22  Yes Priscille Shadduck, DO  acetaminophen (TYLENOL) 325 MG tablet Take 325-650 mg by mouth every 6 (six) hours as needed for mild pain or headache.    [provider]  amLODipine (NORVASC) 5 MG tablet Take 5 mg by mouth daily. 02/05/22   [provider]  atorvastatin (LIPITOR) 40 MG tablet Take 1 tablet (40 mg total) by mouth daily. 12/01/21   Virgie Dad, MD  levothyroxine (SYNTHROID) 100 MCG tablet TAKE 1 TABLET BY MOUTH EVERY DAY BEFORE BREAKFAST 12/28/21   Virgie Dad, MD  lisinopril (ZESTRIL) 10 MG tablet TAKE 1 TABLET BY MOUTH EVERY DAY 10/23/21   Virgie Dad, MD  methocarbamol (ROBAXIN) 500 MG tablet Take 1 tablet (500 mg total) by mouth every 8 (eight) hours as needed for muscle spasms. 04/22/21   Lucrezia Starch, MD  Multiple Vitamins-Minerals (PRESERVISION AREDS PO) Take 1 capsule by mouth 2 (two) times a day.    [provider]  rivaroxaban (XARELTO) 10 MG TABS tablet Take 1 tablet (10 mg total) by mouth daily. 12/01/21   Virgie Dad, MD      Allergies    Neosporin [neomycin-bacitracin zn-polymyx] and Penicillins     Review of Systems   Review of Systems  Physical Exam Updated Vital Signs BP (!) 142/75 (BP Location: Right Arm)   Pulse 71   Temp 97.7 F (36.5 C) (Oral)   Resp 16   Ht 6' 5"$  (1.956 m)   Wt 83.5 kg   SpO2 100%   BMI 21.83 kg/m  Physical Exam Vitals and nursing note reviewed.  Constitutional:      General: He is not in acute distress.    Appearance: He is well-developed.  HENT:     Head: Normocephalic and atraumatic.  Eyes:     Conjunctiva/sclera: Conjunctivae normal.  Cardiovascular:     Rate and Rhythm: Normal rate and regular rhythm.     Heart sounds: No murmur heard. Pulmonary:     Effort: Pulmonary effort is normal. No respiratory distress.     Breath sounds: Normal breath sounds.  Abdominal:     Palpations: Abdomen is soft.     Tenderness: There is no abdominal tenderness.     Hernia: A hernia is present.     Comments: Easily reducible right inguinal hernia on exam  Musculoskeletal:        General: No swelling.     Cervical back: Neck supple.  Skin:    General: Skin is warm and dry.     Capillary Refill: Capillary refill takes less than 2 seconds.  Comments: Area of eczema around his bellybutton/cellulitis  Neurological:     Mental Status: He is alert.  Psychiatric:        Mood and Affect: Mood normal.     ED Results / Procedures / Treatments   Labs (all labs ordered are listed, but only abnormal results are displayed) Labs Reviewed  CBC WITH DIFFERENTIAL/PLATELET - Abnormal; Notable for the following components:      Result Value   RBC 3.89 (*)    Hemoglobin 12.8 (*)    HCT 37.7 (*)    All other components within normal limits  COMPREHENSIVE METABOLIC PANEL - Abnormal; Notable for the following components:   CO2 19 (*)    Glucose, Bld 144 (*)    BUN 52 (*)    Creatinine, Ser 3.34 (*)    AST 13 (*)    GFR, Estimated 16 (*)    All other components within normal limits  LIPASE, BLOOD - Abnormal; Notable for the following components:    Lipase 100 (*)    All other components within normal limits    EKG None  Radiology CT ABDOMEN PELVIS WO CONTRAST  Result Date: 03/19/2022 CLINICAL DATA:  Acute abdominal pain beginning a few weeks ago EXAM: CT ABDOMEN AND PELVIS WITHOUT CONTRAST TECHNIQUE: Multidetector CT imaging of the abdomen and pelvis was performed following the standard protocol without IV contrast. RADIATION DOSE REDUCTION: This exam was performed according to the departmental dose-optimization program which includes automated exposure control, adjustment of the mA and/or kV according to patient size and/or use of iterative reconstruction technique. COMPARISON:  Multiple exams, including CT pelvis 04/22/2021 and renal ultrasound 11/09/2021 FINDINGS: Lower chest: 6 by 7 by 8 mm (volume = 200 mm^3) noncalcified right middle lobe pulmonary nodule, image 6 series 4, generally smooth borders. Mild scarring of both lung bases. Mild reticulonodular subpleural opacity along the left major fissure. Descending thoracic aortic atherosclerotic vascular disease. Hepatobiliary: 3 mm gallstone in the gallbladder, image 22 series 5. At least two subcentimeter hypodense lesions in the liver, too small to characterize. Mildly contracted gallbladder. No biliary dilatation. 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 No further imaging workup of this lesion is indicated. The patient has a history of complex renal cysts which are again observed today, these have been previously characterized sonographically and do not require current follow up. 5 mm right kidney lower pole nonobstructive renal calculus. No hydronephrosis or hydroureter. Stomach/Bowel: Redundant sigmoid colon with descending and sigmoid colon diverticulosis but no findings of active diverticulitis. Indirect right inguinal hernia contains the cecum. Appendix not visualized and may be included  in the hernia. The terminal ileum is just proximal to the inguinal ring. No compelling evidence of strangulation or obstruction. Correlate with physical exam in determining reducibility Vascular/Lymphatic: Atherosclerosis is present, including aortoiliac atherosclerotic disease. There is atheromatous calcification proximally in the celiac trunk and SMA. Infrarenal IVC filter. Reproductive: Prostatomegaly. Other: No supplemental non-categorized findings. Musculoskeletal: Right hip prosthesis. Remote 80% compression fracture at T12. Prominent Schmorl's nodes at the L1-2 level. Grade 1 degenerative anterolisthesis at L4-5. Transitional L5 vertebra. Bilateral foraminal impingement at L3-4 and L4-5. Probable right foraminal impingement at L1-2. IMPRESSION: 1. Indirect right inguinal hernia contains the cecum. The terminal ileum is just proximal to the inguinal ring (outside of the hernia). No compelling evidence of strangulation or obstruction. Correlate with physical exam in determining reducibility. 2. 5 mm right kidney lower pole  nonobstructive renal calculus. 3. 8 mm right middle lobe noncalcified pulmonary nodule. Non-contrast chest CT at 6-12 months is recommended. If the nodule is stable at time of repeat CT, then future CT at 18-24 months (from today's scan) is considered optional for low-risk patients, but is recommended for high-risk patients. This recommendation follows the consensus statement: Guidelines for Management of Incidental Pulmonary Nodules Detected on CT Images: From the Fleischner Society 2017; Radiology 2017; 284:228-243. 4. Aortic atherosclerosis. Aortic Atherosclerosis (ICD10-I70.0). Electronically Signed   By: Van Clines M.D.   On: 03/19/2022 13:48    Procedures Procedures    Medications Ordered in ED Medications - No data to display  ED Course/ Medical Decision Making/ A&P                             Medical Decision Making Amount and/or Complexity of Data  Reviewed Labs: ordered. Radiology: ordered.  Risk Prescription drug management.   Stanley Brooks is here with abdominal pain.  History of DVT, kidney stones, hypertension.  He is already had CT scan and blood work done prior to my evaluation.  Normal vitals.  No fever.  He has a right inguinal hernia that is seen a CT scan abdomen pelvis but no other acute findings.  He had incidental pulmonary nodules that he was made aware about.  I was able to easily reduce inguinal hernia.  Instructed him about MiraLAX and no straining or heavy lifting. Follow-up with his primary care doctor.  Unlikely that there would be a surgical repair unless there is some complication at this time.  He can discuss that with his primary doctor.  He has a small area of eczema/cellulitis around his bellybutton will prescribe Bactroban.  His blood work that was done in triage per my review interpretation unremarkable.  No significant anemia or electrolyte abnormality.  Kidney function is at baseline.  Discharged in good condition.  Understands return precautions.  This chart was dictated using voice recognition software.  Despite best efforts to proofread,  errors can occur which can change the documentation meaning.         Final Clinical Impression(s) / ED Diagnoses Final diagnoses:  Non-recurrent unilateral inguinal hernia without obstruction or gangrene    Rx / DC Orders ED Discharge Orders          Ordered    mupirocin cream (BACTROBAN) 2 %  2 times daily        03/19/22 1435              Arna Luis, DO 03/19/22 1440

## 2022-03-19 NOTE — ED Triage Notes (Signed)
Patient here POV from Home.  Endorses Lower ABD Pain that began a few weeks ago. Intermittent in [place] but became worse Yesterday PM. Noted a Large Bulge to Right Groin yesterday PM as well.   No N/V/D.   NAD Noted during Triage. A&Ox4. GCS 15. Ambulatory.

## 2022-03-19 NOTE — ED Notes (Signed)
Discharge paperwork given and verbally understood. 

## 2022-03-19 NOTE — Telephone Encounter (Signed)
Patient called about a growth he says he has on his abdomen. He wants it to be checked. I offered patient an appointment to see Cleophus Molt on Monday. Patient stated that he wants to see someone today. Patient was given telephone number of the [facility] nurse to see if he could get in contact with her and discuss his problem.

## 2022-03-19 NOTE — Discharge Instructions (Addendum)
Take Bactroban for area of redness around your bellybutton.  Overall you have a hernia that is easily reducible.  Try to use MiraLAX to make sure you are not straining when you have bowel movement.  Avoid any heavy lifting.  If the bulge comes back out again you can gently push it back in.  If you are having any extreme pain or skin changes in this area please return for evaluation.  Follow-up with your primary care doctor to discuss hernia and incidental findings on CT scans below.  Have them recheck your area of redness around your bellybutton as well.  Incidental findings on CT scan to discuss with primary care doctor:  Pulmonary nodule, Non-contrast chest CT at 6-12 months is recommended. If the nodule  is stable at time of repeat CT, then future CT at 18-24 months (from  today's scan) is considered optional for low-risk patients, but is  recommended for high-risk patients. This recommendation follows the  consensus statement: Guidelines for Management of Incidental  Pulmonary Nodules Detected on CT Images: From the Fleischner Society  2017; Radiology 2017; 284:228-243.

## 2022-03-27 ENCOUNTER — Other Ambulatory Visit: Payer: Self-pay | Admitting: Internal Medicine

## 2022-03-29 NOTE — Telephone Encounter (Signed)
Pharmacy requested refill. Patient needs an appointment before anymore future refills.   Pended Rx and sent to Dr. Lyndel Safe for approval due to [place] Warning.

## 2022-04-21 ENCOUNTER — Other Ambulatory Visit: Payer: Self-pay | Admitting: Internal Medicine

## 2022-04-21 NOTE — Telephone Encounter (Signed)
No pending appointment and I am not sure when patient is to follow-up with PCP as it was not indicated in last OV on 12/01/21.  I will forward refill request to Stanley Dad, MD to advise on when next appointment is due. Once I receive a reply, I will approve rx with enough refills and send him a mychart message to schedule an appointment

## 2022-04-29 DIAGNOSIS — H353211 Exudative age-related macular degeneration, right eye, with active choroidal neovascularization: Secondary | ICD-10-CM | POA: Diagnosis not present

## 2022-04-29 DIAGNOSIS — H35371 Puckering of macula, right eye: Secondary | ICD-10-CM | POA: Diagnosis not present

## 2022-04-29 DIAGNOSIS — H353124 Nonexudative age-related macular degeneration, left eye, advanced atrophic with subfoveal involvement: Secondary | ICD-10-CM | POA: Diagnosis not present

## 2022-04-29 DIAGNOSIS — H33322 Round hole, left eye: Secondary | ICD-10-CM | POA: Diagnosis not present

## 2022-04-29 DIAGNOSIS — H43393 Other vitreous opacities, bilateral: Secondary | ICD-10-CM | POA: Diagnosis not present

## 2022-04-29 DIAGNOSIS — H43813 Vitreous degeneration, bilateral: Secondary | ICD-10-CM | POA: Diagnosis not present

## 2022-04-29 DIAGNOSIS — H35033 Hypertensive retinopathy, bilateral: Secondary | ICD-10-CM | POA: Diagnosis not present

## 2022-05-19 DIAGNOSIS — H903 Sensorineural hearing loss, bilateral: Secondary | ICD-10-CM | POA: Diagnosis not present

## 2022-05-24 ENCOUNTER — Other Ambulatory Visit: Payer: Self-pay | Admitting: Internal Medicine

## 2022-05-25 ENCOUNTER — Encounter: Payer: Self-pay | Admitting: Internal Medicine

## 2022-05-25 ENCOUNTER — Non-Acute Institutional Stay: Payer: Medicare PPO | Admitting: Internal Medicine

## 2022-05-25 VITALS — BP 128/66 | HR 64 | Temp 98.0°F | Resp 17 | Ht 77.0 in | Wt 176.0 lb

## 2022-05-25 DIAGNOSIS — I10 Essential (primary) hypertension: Secondary | ICD-10-CM | POA: Diagnosis not present

## 2022-05-25 DIAGNOSIS — M545 Low back pain, unspecified: Secondary | ICD-10-CM

## 2022-05-25 DIAGNOSIS — I82402 Acute embolism and thrombosis of unspecified deep veins of left lower extremity: Secondary | ICD-10-CM

## 2022-05-25 DIAGNOSIS — R911 Solitary pulmonary nodule: Secondary | ICD-10-CM | POA: Diagnosis not present

## 2022-05-25 DIAGNOSIS — E7800 Pure hypercholesterolemia, unspecified: Secondary | ICD-10-CM

## 2022-05-25 DIAGNOSIS — G8929 Other chronic pain: Secondary | ICD-10-CM

## 2022-05-25 DIAGNOSIS — E039 Hypothyroidism, unspecified: Secondary | ICD-10-CM

## 2022-05-25 DIAGNOSIS — N184 Chronic kidney disease, stage 4 (severe): Secondary | ICD-10-CM | POA: Diagnosis not present

## 2022-05-25 NOTE — Progress Notes (Unsigned)
Location:  [facility] features editor of Service:  Clinic (12)  Provider:   Code Status: DNR Goals of Care:     05/25/2022   10:35 AM  Advanced Directives  Does Patient Have a Medical Advance Directive? Yes  Type of Estate agent of [place];Living will;Out of facility DNR (pink MOST or yellow form)  Copy of Healthcare Power of Attorney in Chart? No - copy requested     Chief Complaint  Patient presents with   Medical Management of Chronic Issues    6 month follow up. Patient states he has been having some pain in the back on the right side   Immunizations    Discussed the need for Tetanus and Shingles vaccine   Quality Metric Gaps    Patient is scheduled for AWV    HPI: Patient is a 89 y.o. male seen today for medical management of chronic diseases.    [place] in [place] in [place] with his wife   Has history of macular degeneration.  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 Clearance Coots    DVT (deep venous thrombosis)    Exudative age-related macular degeneration of right eye with active choroidal neovascularization    Hypercholesterolemia    Hypertension    Hypothyroid    Kidney stone    Vitreous detachment, bilateral 03/15/2019    Past Surgical History:  Procedure Laterality Date   CATARACT EXTRACTION W/ INTRAOCULAR  LENS IMPLANT Bilateral    COLONOSCOPY     Dr. Inocencio Homes, prior polyps, but last due to age   HEMORROIDECTOMY  89   Dr. Merrie Roof repair of right leg  Right    TOTAL HIP ARTHROPLASTY      Allergies  Allergen Reactions   Neosporin [Neomycin-Bacitracin Zn-Polymyx] Dermatitis   Penicillins Other (See Comments)    Reaction not recalled, but the "reaction was bad"    Outpatient Encounter Medications as of 05/25/2022  Medication Sig   acetaminophen (TYLENOL) 325 MG tablet Take 325-650 mg by mouth every 6 (six) hours as needed for mild pain or headache.   atorvastatin (LIPITOR) 40 MG tablet Take 1 tablet (40 mg total) by mouth daily.   levothyroxine (SYNTHROID) 100 MCG tablet TAKE 1 TABLET BY MOUTH EVERY DAY BEFORE BREAKFAST   lisinopril (ZESTRIL) 10 MG tablet TAKE 1 TABLET BY MOUTH EVERY DAY   methocarbamol (ROBAXIN) 500 MG tablet Take 1 tablet (500 mg total) by mouth every 8 (eight) hours as needed for muscle spasms.   Multiple Vitamins-Minerals (PRESERVISION AREDS PO) Take 1 capsule by mouth 2 (two) times a day.   rivaroxaban (XARELTO) 10 MG TABS tablet Take 1 tablet (10 mg total) by mouth daily.   amLODipine (NORVASC) 5 MG tablet Take 5 mg by mouth daily. (Patient not taking: Reported on 05/25/2022)   [  DISCONTINUED] mupirocin ointment (BACTROBAN) 2 % Apply 1 Application topically 2 (two) times daily. (Patient not taking: Reported on 05/25/2022)   No facility-administered encounter medications on file as of 05/25/2022.    Review of Systems:  Review of Systems  Constitutional:  Negative for activity change, appetite change and unexpected weight change.  HENT: Negative.    Respiratory:  Negative for cough and shortness of breath.   Cardiovascular:  Negative for leg swelling.  Gastrointestinal:  Negative for constipation.  Genitourinary:  Negative for frequency.  Musculoskeletal:  Positive for back pain and gait problem. Negative for arthralgias and myalgias.  Skin: Negative.   Negative for rash.  Neurological:  Negative for dizziness and weakness.  Psychiatric/Behavioral:  Negative for confusion and sleep disturbance.   All other systems reviewed and are negative.   Health Maintenance  Topic Date Due   DTaP/Tdap/Td (1 - Tdap) Never done   Zoster Vaccines- Shingrix (1 of 2) Never done   Medicare Annual Wellness (AWV)  12/06/2020   COVID-19 Vaccine (6 - 2023-24 season) 06/10/2022 (Originally 10/09/2021)   INFLUENZA VACCINE  09/09/2022   Pneumonia Vaccine 83+ Years old  Completed   HPV VACCINES  Aged Out    Physical Exam: Vitals:   05/25/22 1031  BP: 128/66  Pulse: 64  Resp: 17  Temp: 98 F (36.7 C)  TempSrc: Temporal  SpO2: 99%  Weight: 176 lb (79.8 kg)  Height:  (1.956 m)   Body mass index is 20.87 kg/m. Physical Exam Vitals reviewed.  Constitutional:      Appearance: Normal appearance.  HENT:     Head: Normocephalic.     Nose: Nose normal. No congestion.     Mouth/Throat:     Mouth: Mucous membranes are moist.     Pharynx: Oropharynx is clear.  Eyes:     Pupils: Pupils are equal, round, and reactive to light.  Cardiovascular:     Rate and Rhythm: Normal rate and regular rhythm.     Pulses: Normal pulses.     Heart sounds: No murmur heard. Pulmonary:     Effort: Pulmonary effort is normal. No respiratory distress.     Breath sounds: Normal breath sounds. No rales.  Abdominal:     General: Abdomen is flat. Bowel sounds are normal.     Palpations: Abdomen is soft.  Musculoskeletal:        General: No swelling.     Cervical back: Neck supple.  Skin:    General: Skin is warm.  Neurological:     General: No focal deficit present.     Mental Status: He is alert and oriented to person, place, and time.  Psychiatric:        Mood and Affect: Mood normal.        Thought Content: Thought content normal.     Labs reviewed: Basic Metabolic Panel: Recent Labs    06/23/21 0000 08/20/21 0000 11/24/21 0000 01/21/22 0000  03/19/22 1306  NA 146 138 140 140 140  K 4.8 5.0 5.0 5.4* 4.3  CL 111* 105 108 109* 108  CO2 19*  GLUCOSE  --   --   --   --  144*  BUN 40* 39* 46* 45* 52*  CREATININE 2.8* 2.7* 3.0* 3.2* 3.34*  CALCIUM 8.8 8.6* 9.2 8.8 9.5  TSH 7.42* 5.93* 0.93  --   --    Liver Function Tests: Recent Labs    11/24/21 0000 01/21/22 0000 03/19/22 1306  AST  18  --  13*  ALT 8*  --  11  ALKPHOS 113  --  89  BILITOT  --   --  0.5  PROT  --   --  7.2  ALBUMIN 4.2 4.1 4.4   Recent Labs    03/19/22 1306  LIPASE 100*   No results for input(s): "AMMONIA" in the last 8760 hours. CBC: Recent Labs    06/23/21 0000 08/20/21 0000 03/19/22 1306  WBC 4.5 4.6 5.3  NEUTROABS  --   --  3.9  HGB 13.7 13.6 12.8*  HCT 40* 41 37.7*  MCV  --   --  96.9  PLT 156 159 158   Lipid Panel: Recent Labs    11/24/21 0000  CHOL 271*  HDL 46  LDLCALC 194  TRIG 161   No results found for: "HGBA1C"  Procedures since last visit: No results found.  Assessment/Plan 1. Chronic kidney disease (CKD), stage IV (severe) Need to Follow with Renal No NSAIDS He is aware Some acidosis will need Bicarb  2. Chronic bilateral low back pain without sciatica Can take Tylenol  Also taking Robaxin  3. Lung nodule CT scan follow up in 6 months  4. DVT, lower extremity, recurrent, left On Low dose of Xarelto  5. Pure hypercholesterolemia On statin  6. Acquired hypothyroidism Needs repeat TSH  7. Essential hypertension On lisinopril    Labs/tests ordered:  CBC,CMP,TSH,Lipid Next appt:  07/12/2022

## 2022-05-25 NOTE — Patient Instructions (Addendum)
Need to take [place] Vaccine Also Take Flonase OTC as needed for Nasal Congestion  2 spray once a day as needed Can take Tylenol 500 mg as needed for back pain

## 2022-06-17 DIAGNOSIS — H353124 Nonexudative age-related macular degeneration, left eye, advanced atrophic with subfoveal involvement: Secondary | ICD-10-CM | POA: Diagnosis not present

## 2022-06-17 DIAGNOSIS — H353211 Exudative age-related macular degeneration, right eye, with active choroidal neovascularization: Secondary | ICD-10-CM | POA: Diagnosis not present

## 2022-06-17 DIAGNOSIS — H43813 Vitreous degeneration, bilateral: Secondary | ICD-10-CM | POA: Diagnosis not present

## 2022-06-17 DIAGNOSIS — H43393 Other vitreous opacities, bilateral: Secondary | ICD-10-CM | POA: Diagnosis not present

## 2022-06-17 DIAGNOSIS — H35033 Hypertensive retinopathy, bilateral: Secondary | ICD-10-CM | POA: Diagnosis not present

## 2022-06-17 DIAGNOSIS — H35371 Puckering of macula, right eye: Secondary | ICD-10-CM | POA: Diagnosis not present

## 2022-06-17 DIAGNOSIS — H33322 Round hole, left eye: Secondary | ICD-10-CM | POA: Diagnosis not present

## 2022-06-18 ENCOUNTER — Other Ambulatory Visit: Payer: Self-pay | Admitting: Internal Medicine

## 2022-06-18 NOTE — Telephone Encounter (Signed)
Pharmacy requested refill. Patient is currently taking Atorvastatin 40mg  daily.   Pended Rx and sent to Dr. Chales Abrahams for approval due to HIGH ALERT Warning.

## 2022-07-12 ENCOUNTER — Encounter: Payer: Medicare PPO | Admitting: Adult Health

## 2022-07-13 ENCOUNTER — Non-Acute Institutional Stay: Payer: Medicare PPO | Admitting: Internal Medicine

## 2022-07-13 ENCOUNTER — Encounter: Payer: Self-pay | Admitting: Internal Medicine

## 2022-07-13 VITALS — BP 160/82 | HR 70 | Temp 97.8°F | Resp 18 | Ht 77.0 in

## 2022-07-13 DIAGNOSIS — I10 Essential (primary) hypertension: Secondary | ICD-10-CM | POA: Diagnosis not present

## 2022-07-13 DIAGNOSIS — E039 Hypothyroidism, unspecified: Secondary | ICD-10-CM | POA: Diagnosis not present

## 2022-07-13 DIAGNOSIS — I82402 Acute embolism and thrombosis of unspecified deep veins of left lower extremity: Secondary | ICD-10-CM | POA: Diagnosis not present

## 2022-07-13 DIAGNOSIS — G8929 Other chronic pain: Secondary | ICD-10-CM | POA: Diagnosis not present

## 2022-07-13 DIAGNOSIS — M545 Low back pain, unspecified: Secondary | ICD-10-CM | POA: Diagnosis not present

## 2022-07-13 DIAGNOSIS — E7800 Pure hypercholesterolemia, unspecified: Secondary | ICD-10-CM

## 2022-07-13 DIAGNOSIS — N184 Chronic kidney disease, stage 4 (severe): Secondary | ICD-10-CM

## 2022-07-13 DIAGNOSIS — R911 Solitary pulmonary nodule: Secondary | ICD-10-CM | POA: Diagnosis not present

## 2022-07-13 NOTE — Patient Instructions (Signed)
Can use Tylenol 650 mg every morning for back pain Do not take any Aleve or Motrin or Advil

## 2022-07-18 NOTE — Progress Notes (Signed)
Location: [facility] features editor of Service:  Clinic (12)  Provider:   Code Status:  Goals of Care:     07/13/2022    3:37 PM  Advanced Directives  Does Patient Have a Medical Advance Directive? Yes  Type of Estate agent of [place];Living will;Out of facility DNR (pink MOST or yellow form)  Does patient want to make changes to medical advance directive? No - Patient declined  Copy of Healthcare Power of Attorney in Chart? No - copy requested     Chief Complaint  Patient presents with   Acute Visit    Patient is being seen for back pain   Immunizations    Patient is due for tdap, shingles and covid vaccine     HPI: Patient is a 89 y.o. male seen today for an acute visit for Low Back Pain Lives in [place] in [place] with his wife    Came with Acute issue of Lower back pain which is chronic for him Some worsening recently Wanted to know if he can take Tylenol Does not want anything stronger then Tylenol No Fall Walking with his Stick Doe shave unstable gait No other red flags Is planning to go the beach in few weeks  Also  Has history of macular degeneration.  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 Clearance Coots    DVT (deep venous thrombosis) (HCC)    Exudative age-related macular degeneration of right eye with active choroidal neovascularization (HCC)     Hypercholesterolemia    Hypertension    Hypothyroid    Kidney stone    Vitreous detachment, bilateral 03/15/2019    Past Surgical History:  Procedure Laterality Date   CATARACT EXTRACTION W/ INTRAOCULAR LENS IMPLANT Bilateral    COLONOSCOPY     Dr. Inocencio Homes, prior polyps, but last due to age   HEMORROIDECTOMY  49   Dr. Merrie Roof repair of right leg  Right    TOTAL HIP ARTHROPLASTY      Allergies  Allergen Reactions   Neosporin [Neomycin-Bacitracin Zn-Polymyx] Dermatitis   Penicillins Other (See Comments)    Reaction not recalled, but the "reaction was bad"    Outpatient Encounter Medications as of 07/13/2022  Medication Sig   atorvastatin (LIPITOR) 40 MG tablet Take 1 tablet (40 mg total) by mouth daily.   levothyroxine (SYNTHROID) 100 MCG tablet TAKE 1 TABLET BY MOUTH EVERY DAY BEFORE BREAKFAST   lisinopril (ZESTRIL) 10 MG tablet TAKE 1 TABLET BY MOUTH EVERY DAY   Multiple Vitamins-Minerals (PRESERVISION AREDS PO) Take 1 capsule by mouth 2 (two) times a day.   rivaroxaban (XARELTO) 10 MG TABS tablet Take 1 tablet (10 mg total) by mouth daily.   acetaminophen (TYLENOL) 325 MG tablet Take 325-650 mg by mouth every 6 (six) hours as needed for mild pain or headache. (Patient  not taking: Reported on 07/13/2022)   atorvastatin (LIPITOR) 40 MG tablet Take 1 tablet (40 mg total) by mouth daily. (Patient not taking: Reported on 07/13/2022)   methocarbamol (ROBAXIN) 500 MG tablet Take 1 tablet (500 mg total) by mouth every 8 (eight) hours as needed for muscle spasms. (Patient not taking: Reported on 07/13/2022)   No facility-administered encounter medications on file as of 07/13/2022.    Review of Systems:  Review of Systems  Constitutional:  Positive for activity change. Negative for appetite change and unexpected weight change.  HENT: Negative.    Respiratory:  Negative for cough and shortness of breath.   Cardiovascular:  Negative for leg swelling.  Gastrointestinal:  Negative  for constipation.  Genitourinary:  Negative for frequency.  Musculoskeletal:  Positive for back pain and gait problem. Negative for arthralgias and myalgias.  Skin: Negative.  Negative for rash.  Neurological:  Negative for dizziness and weakness.  Psychiatric/Behavioral:  Negative for confusion and sleep disturbance.   All other systems reviewed and are negative.   Health Maintenance  Topic Date Due   DTaP/Tdap/Td (1 - Tdap) Never done   Zoster Vaccines- Shingrix (1 of 2) Never done   COVID-19 Vaccine (6 - 2023-24 season) 10/09/2021   Medicare Annual Wellness (AWV)  08/12/2022 (Originally 12/06/2020)   INFLUENZA VACCINE  09/09/2022   Pneumonia Vaccine 58+ Years old  Completed   HPV VACCINES  Aged Out    Physical Exam: Vitals:   07/13/22 1537 07/13/22 1540  BP: (!) 164/86 (!) 160/82  Pulse: 70   Resp: 18   Temp: 97.8 F (36.6 C)   TempSrc: Temporal   SpO2: 94%   Height: 6\' 5"  (1.956 m)    Body mass index is 20.87 kg/m. Physical Exam Vitals reviewed.  Constitutional:      Appearance: Normal appearance.  HENT:     Head: Normocephalic.     Nose: Nose normal.     Mouth/Throat:     Mouth: Mucous membranes are moist.     Pharynx: Oropharynx is clear.  Eyes:     Pupils: Pupils are equal, round, and reactive to light.  Cardiovascular:     Rate and Rhythm: Normal rate and regular rhythm.     Pulses: Normal pulses.     Heart sounds: No murmur heard. Pulmonary:     Effort: Pulmonary effort is normal. No respiratory distress.     Breath sounds: Normal breath sounds. No rales.  Abdominal:     General: Abdomen is flat. Bowel sounds are normal.     Palpations: Abdomen is soft.  Musculoskeletal:        General: No swelling.     Cervical back: Neck supple.     Comments: Straight leg negative No point tenderness in his back  Skin:    General: Skin is warm.  Neurological:     General: No focal deficit present.     Mental Status: He is alert and oriented to person, place,  and time.  Psychiatric:        Mood and Affect: Mood normal.        Thought Content: Thought content normal.     Labs reviewed: Basic Metabolic Panel: Recent Labs    08/20/21 0000 11/24/21 0000 01/21/22 0000 03/19/22 1306  NA 138 140 140 140  K 5.0 5.0 5.4* 4.3  CL 105 108 109* 108  CO2 19 18 21  19*  GLUCOSE  --   --   --  144*  BUN 39*  46* 45* 52*  CREATININE 2.7* 3.0* 3.2* 3.34*  CALCIUM 8.6* 9.2 8.8 9.5  TSH 5.93* 0.93  --   --    Liver Function Tests: Recent Labs    11/24/21 0000 01/21/22 0000 03/19/22 1306  AST 18  --  13*  ALT 8*  --  11  ALKPHOS 113  --  89  BILITOT  --   --  0.5  PROT  --   --  7.2  ALBUMIN 4.2 4.1 4.4   Recent Labs    03/19/22 1306  LIPASE 100*   No results for input(s): "AMMONIA" in the last 8760 hours. CBC: Recent Labs    08/20/21 0000 03/19/22 1306  WBC 4.6 5.3  NEUTROABS  --  3.9  HGB 13.6 12.8*  HCT 41 37.7*  MCV  --  96.9  PLT 159 158   Lipid Panel: Recent Labs    11/24/21 0000  CHOL 271*  HDL 46  LDLCALC 194  TRIG 161   No results found for: "HGBA1C"  Procedures since last visit: No results found.  Assessment/Plan 1. Chronic kidney disease (CKD), stage IV (severe) (HCC) No NAIDS Follow with Renal  2. Chronic bilateral low back pain without sciatica Can take Tylenol PRN Does not want Robaxin right now 3. Lung nodule CT scan follow up in 6 months   4. DVT, lower extremity, recurrent, left (HCC) Xarelto Low dose  5. Pure hypercholesterolemia On statin  6. Acquired hypothyroidism TSH repeat before next visit  7. Essential hypertension lisinopril Was on Norvasc at one point Not taking it anymore Will need follow up of his BP   Labs/tests ordered:  * No order type specified * Next appt:  09/13/2022

## 2022-08-02 DIAGNOSIS — Z85820 Personal history of malignant melanoma of skin: Secondary | ICD-10-CM | POA: Diagnosis not present

## 2022-08-02 DIAGNOSIS — L578 Other skin changes due to chronic exposure to nonionizing radiation: Secondary | ICD-10-CM | POA: Diagnosis not present

## 2022-08-02 DIAGNOSIS — D1801 Hemangioma of skin and subcutaneous tissue: Secondary | ICD-10-CM | POA: Diagnosis not present

## 2022-08-02 DIAGNOSIS — Z85828 Personal history of other malignant neoplasm of skin: Secondary | ICD-10-CM | POA: Diagnosis not present

## 2022-08-02 DIAGNOSIS — L821 Other seborrheic keratosis: Secondary | ICD-10-CM | POA: Diagnosis not present

## 2022-08-02 DIAGNOSIS — Z08 Encounter for follow-up examination after completed treatment for malignant neoplasm: Secondary | ICD-10-CM | POA: Diagnosis not present

## 2022-08-02 DIAGNOSIS — L820 Inflamed seborrheic keratosis: Secondary | ICD-10-CM | POA: Diagnosis not present

## 2022-08-02 DIAGNOSIS — L570 Actinic keratosis: Secondary | ICD-10-CM | POA: Diagnosis not present

## 2022-08-05 DIAGNOSIS — H35371 Puckering of macula, right eye: Secondary | ICD-10-CM | POA: Diagnosis not present

## 2022-08-05 DIAGNOSIS — H43813 Vitreous degeneration, bilateral: Secondary | ICD-10-CM | POA: Diagnosis not present

## 2022-08-05 DIAGNOSIS — H35033 Hypertensive retinopathy, bilateral: Secondary | ICD-10-CM | POA: Diagnosis not present

## 2022-08-05 DIAGNOSIS — H353124 Nonexudative age-related macular degeneration, left eye, advanced atrophic with subfoveal involvement: Secondary | ICD-10-CM | POA: Diagnosis not present

## 2022-08-05 DIAGNOSIS — H353211 Exudative age-related macular degeneration, right eye, with active choroidal neovascularization: Secondary | ICD-10-CM | POA: Diagnosis not present

## 2022-08-05 DIAGNOSIS — H43393 Other vitreous opacities, bilateral: Secondary | ICD-10-CM | POA: Diagnosis not present

## 2022-08-05 DIAGNOSIS — H33322 Round hole, left eye: Secondary | ICD-10-CM | POA: Diagnosis not present

## 2022-08-06 DIAGNOSIS — H6123 Impacted cerumen, bilateral: Secondary | ICD-10-CM | POA: Diagnosis not present

## 2022-08-06 DIAGNOSIS — Z974 Presence of external hearing-aid: Secondary | ICD-10-CM | POA: Diagnosis not present

## 2022-08-24 DIAGNOSIS — N2581 Secondary hyperparathyroidism of renal origin: Secondary | ICD-10-CM | POA: Diagnosis not present

## 2022-08-24 DIAGNOSIS — N184 Chronic kidney disease, stage 4 (severe): Secondary | ICD-10-CM | POA: Diagnosis not present

## 2022-08-24 DIAGNOSIS — D631 Anemia in chronic kidney disease: Secondary | ICD-10-CM | POA: Diagnosis not present

## 2022-08-24 DIAGNOSIS — I129 Hypertensive chronic kidney disease with stage 1 through stage 4 chronic kidney disease, or unspecified chronic kidney disease: Secondary | ICD-10-CM | POA: Diagnosis not present

## 2022-08-24 DIAGNOSIS — I82403 Acute embolism and thrombosis of unspecified deep veins of lower extremity, bilateral: Secondary | ICD-10-CM | POA: Diagnosis not present

## 2022-08-24 DIAGNOSIS — E039 Hypothyroidism, unspecified: Secondary | ICD-10-CM | POA: Diagnosis not present

## 2022-08-25 LAB — IRON,TIBC AND FERRITIN PANEL
%SAT: 31
Iron: 72
TIBC: 232
UIBC: 160

## 2022-08-25 LAB — BASIC METABOLIC PANEL
BUN: 53 — AB (ref 4–21)
CO2: 18 (ref 13–22)
Chloride: 108 (ref 99–108)
Creatinine: 3.4 — AB (ref 0.6–1.3)
Glucose: 97
Potassium: 4.6 mEq/L (ref 3.5–5.1)
Sodium: 139 (ref 137–147)

## 2022-08-25 LAB — COMPREHENSIVE METABOLIC PANEL
Albumin: 4.2 (ref 3.5–5.0)
Calcium: 8.6 — AB (ref 8.7–10.7)
eGFR: 16

## 2022-08-25 LAB — CBC: RBC: 4.02 (ref 3.87–5.11)

## 2022-08-25 LAB — CBC AND DIFFERENTIAL
HCT: 38 — AB (ref 41–53)
Hemoglobin: 12.6 — AB (ref 13.5–17.5)
Neutrophils Absolute: 3.8
Platelets: 155 10*3/uL (ref 150–400)
WBC: 5.2

## 2022-08-25 LAB — VITAMIN D 25 HYDROXY (VIT D DEFICIENCY, FRACTURES): Vit D, 25-Hydroxy: 22.2

## 2022-08-30 ENCOUNTER — Encounter: Payer: Medicare PPO | Admitting: Adult Health

## 2022-09-02 DIAGNOSIS — H6123 Impacted cerumen, bilateral: Secondary | ICD-10-CM | POA: Diagnosis not present

## 2022-09-02 DIAGNOSIS — H903 Sensorineural hearing loss, bilateral: Secondary | ICD-10-CM | POA: Diagnosis not present

## 2022-09-07 DIAGNOSIS — I129 Hypertensive chronic kidney disease with stage 1 through stage 4 chronic kidney disease, or unspecified chronic kidney disease: Secondary | ICD-10-CM | POA: Diagnosis not present

## 2022-09-07 DIAGNOSIS — E7800 Pure hypercholesterolemia, unspecified: Secondary | ICD-10-CM | POA: Diagnosis not present

## 2022-09-07 LAB — LIPID PANEL
Cholesterol: 143 (ref 0–200)
HDL: 44 (ref 35–70)
LDL Cholesterol: 76
Triglycerides: 116 (ref 40–160)

## 2022-09-07 LAB — BASIC METABOLIC PANEL
BUN: 51 — AB (ref 4–21)
CO2: 19 (ref 13–22)
Chloride: 109 — AB (ref 99–108)
Creatinine: 3.2 — AB (ref 0.6–1.3)
Glucose: 99
Potassium: 4.5 mEq/L (ref 3.5–5.1)
Sodium: 138 (ref 137–147)

## 2022-09-07 LAB — CBC AND DIFFERENTIAL
HCT: 35 — AB (ref 41–53)
Hemoglobin: 12.2 — AB (ref 13.5–17.5)
Platelets: 147 10*3/uL — AB (ref 150–400)
WBC: 4.1

## 2022-09-07 LAB — CBC: RBC: 3.72 — AB (ref 3.87–5.11)

## 2022-09-07 LAB — HEPATIC FUNCTION PANEL
ALT: 15 U/L (ref 10–40)
AST: 21 (ref 14–40)
Alkaline Phosphatase: 113 (ref 25–125)
Bilirubin, Total: 0.4

## 2022-09-07 LAB — COMPREHENSIVE METABOLIC PANEL
Albumin: 4 (ref 3.5–5.0)
Calcium: 8.7 (ref 8.7–10.7)
Globulin: 2

## 2022-09-07 LAB — TSH: TSH: 0.43 (ref 0.41–5.90)

## 2022-09-13 ENCOUNTER — Encounter: Payer: Medicare PPO | Admitting: Adult Health

## 2022-09-13 DIAGNOSIS — R911 Solitary pulmonary nodule: Secondary | ICD-10-CM | POA: Insufficient documentation

## 2022-09-13 NOTE — Progress Notes (Unsigned)
Location:  [facility]  POS: Clinic  Provider: Fletcher Anon, ANP  Code Status: *** Goals of Care:     07/13/2022    3:37 PM  Advanced Directives  Does Patient Have a Medical Advance Directive? Yes  Type of Estate agent of [place];Living will;Out of facility DNR (pink MOST or yellow form)  Does patient want to make changes to medical advance directive? No - Patient declined  Copy of Healthcare Power of Attorney in Chart? No - copy requested     No chief complaint on file.   HPI: Patient is a 89 y.o. male seen today for medical management of chronic diseases.    CKD IV: Likely not a dialysis candidate per renal   Hyperparathyroidism 08/24/22: Low Vit D 22.2 intact PTH 277  Lab Results  Component Value Date   BUN 51 (A) 09/07/2022   Lab Results  Component Value Date   CREATININE 3.2 (A) 09/07/2022     CT of the pelvis for right hip pain on 04/22/21 showed no acute fracture, with enlarged prostate.   He exercises three times a week attends class was on hold after fall but resumed a couple of weeks ago.   Continues on xarelto for recurrent DVT, has an IVC filter   Lives with his wife. Retired Pensions consultant.   HTN: renal note under media in July fo 2024 indicates he should not be taking lisinopril and should be taking norvasc.   Hypothyroidism  CT of the abd done for pain 03/19/22 showing 8 mm RML pulmonary nodule.  Also right inguinal hernia with cecum   Has constipation sometimes and uses miralax prn  Sleeping well. Denies depression.   Has macular degeneration and gets eye injections. Vision stable per his account the past 3-4 years.   Wears hearing aids and seen audiology   [facility]: Past Medical History:  Diagnosis Date   Cataract 2008   left and right eyde By Dr. Clearance Coots    DVT (deep venous thrombosis) (HCC)    Exudative age-related macular degeneration of right eye with active choroidal neovascularization (HCC)    Hypercholesterolemia     Hypertension    Hypothyroid    Kidney stone    Vitreous detachment, bilateral 03/15/2019    Past Surgical History:  Procedure Laterality Date   CATARACT EXTRACTION W/ INTRAOCULAR LENS IMPLANT Bilateral    COLONOSCOPY     Dr. Inocencio Homes, prior polyps, but last due to age   HEMORROIDECTOMY  69   Dr. Merrie Roof repair of right leg  Right    TOTAL HIP ARTHROPLASTY      Allergies  Allergen Reactions   Neosporin [Neomycin-Bacitracin Zn-Polymyx] Dermatitis   Penicillins Other (See Comments)    Reaction not recalled, but the "reaction was bad"    Outpatient Encounter Medications as of 09/13/2022  Medication Sig   acetaminophen (TYLENOL) 325 MG tablet Take 325-650 mg by mouth every 6 (six) hours as needed for mild pain or headache.   atorvastatin (LIPITOR) 40 MG tablet Take 1 tablet (40 mg total) by mouth daily.   levothyroxine (SYNTHROID) 100 MCG tablet TAKE 1 TABLET BY MOUTH EVERY DAY BEFORE BREAKFAST   lisinopril (ZESTRIL) 10 MG tablet TAKE 1 TABLET BY MOUTH EVERY DAY   Multiple Vitamins-Minerals (PRESERVISION AREDS PO) Take 1 capsule by mouth 2 (two) times a day.   rivaroxaban (XARELTO) 10 MG TABS tablet Take 1 tablet (10 mg total) by mouth daily.   No facility-administered encounter medications on file as  of 09/13/2022.    Review of Systems:  Review of Systems  Health Maintenance  Topic Date Due   DTaP/Tdap/Td (1 - Tdap) Never done   Zoster Vaccines- Shingrix (1 of 2) Never done   Medicare Annual Wellness (AWV)  12/06/2020   COVID-19 Vaccine (6 - 2023-24 season) 10/09/2021   INFLUENZA VACCINE  09/09/2022   Pneumonia Vaccine 26+ Years old  Completed   HPV VACCINES  Aged Out    Physical Exam: There were no vitals filed for this visit. There is no height or weight on file to calculate BMI. Physical Exam  Labs reviewed: Basic Metabolic Panel: Recent Labs    11/24/21 0000 01/21/22 0000 03/19/22 1306 08/25/22 0000 09/07/22 0000  NA 140   < > 140 139 138  K  5.0   < > 4.3 4.6 4.5  CL 108   < > 108 108 109*  CO2 18   < > 19* 18 19  GLUCOSE  --   --  144*  --   --   BUN 46*   < > 52* 53* 51*  CREATININE 3.0*   < > 3.34* 3.4* 3.2*  CALCIUM 9.2   < > 9.5 8.6* 8.7  TSH 0.93  --   --   --  0.43   < > = values in this interval not displayed.   Liver Function Tests: Recent Labs    11/24/21 0000 01/21/22 0000 03/19/22 1306 08/25/22 0000 09/07/22 0000  AST 18  --  13*  --  21  ALT 8*  --  11  --  15  ALKPHOS 113  --  89  --  113  BILITOT  --   --  0.5  --   --   PROT  --   --  7.2  --   --   ALBUMIN 4.2   < > 4.4 4.2 4.0   < > = values in this interval not displayed.   Recent Labs    03/19/22 1306  LIPASE 100*   No results for input(s): "AMMONIA" in the last 8760 hours. CBC: Recent Labs    03/19/22 1306 08/25/22 0000 09/07/22 0000  WBC 5.3 5.2 4.1  NEUTROABS 3.9 3.80  --   HGB 12.8* 12.6* 12.2*  HCT 37.7* 38* 35*  MCV 96.9  --   --   PLT 158 155 147*   Lipid Panel: Recent Labs    11/24/21 0000 09/07/22 0000  CHOL 271* 143  HDL 46 44  LDLCALC 194 76  TRIG 159 116   No results found for: "HGBA1C"  Procedures since last visit: No results found.  Assessment/Plan There are no diagnoses linked to this encounter.   Labs/tests ordered:  * No order type specified * Next appt:  Visit date not found   Total time **:  time greater than 50% of total time spent doing pt counseling and coordination of care

## 2022-09-14 ENCOUNTER — Non-Acute Institutional Stay: Payer: Medicare PPO | Admitting: Internal Medicine

## 2022-09-14 ENCOUNTER — Encounter: Payer: Self-pay | Admitting: Internal Medicine

## 2022-09-14 VITALS — BP 150/82 | HR 77 | Temp 97.8°F | Resp 18 | Ht 77.0 in | Wt 169.0 lb

## 2022-09-14 DIAGNOSIS — G8929 Other chronic pain: Secondary | ICD-10-CM | POA: Diagnosis not present

## 2022-09-14 DIAGNOSIS — I10 Essential (primary) hypertension: Secondary | ICD-10-CM

## 2022-09-14 DIAGNOSIS — M545 Low back pain, unspecified: Secondary | ICD-10-CM

## 2022-09-14 DIAGNOSIS — I82402 Acute embolism and thrombosis of unspecified deep veins of left lower extremity: Secondary | ICD-10-CM | POA: Diagnosis not present

## 2022-09-14 DIAGNOSIS — N184 Chronic kidney disease, stage 4 (severe): Secondary | ICD-10-CM | POA: Diagnosis not present

## 2022-09-14 DIAGNOSIS — E7800 Pure hypercholesterolemia, unspecified: Secondary | ICD-10-CM | POA: Diagnosis not present

## 2022-09-14 MED ORDER — AMLODIPINE BESYLATE 5 MG PO TABS: 5.0000 mg | ORAL_TABLET | Freq: Every day | ORAL | 3 refills | Status: AC

## 2022-09-14 NOTE — Patient Instructions (Signed)
Discontinue Lisinopril Start him on Norvasc 5 mg every day Can use Tylenol Prn  Voltaren gel OTC for back pain

## 2022-09-14 NOTE — Progress Notes (Signed)
Location: [facility] of Service:  Clinic (12)  Provider:   Code Status: DNR Goals of Care:     09/14/2022   10:34 AM  Advanced Directives  Does Patient Have a Medical Advance Directive? Yes  Type of Estate agent of [place];Living will;Out of facility DNR (pink MOST or yellow form)  Does patient want to make changes to medical advance directive? No - Patient declined  Copy of Healthcare Power of Attorney in Chart? Yes - validated most recent copy scanned in chart (See row information)     Chief Complaint  Patient presents with   Medical Management of Chronic Issues    Patient is being seen for 3 month follow up   Immunizations    Patient is due for covid , flu, shingles and tdap vaccine    HPI: Patient is a 89 y.o. male seen today for an acute visit for Follow up  Lives with [place] in [place]  CKD Stage 4  Per Nephrology he is suppose to stop his Lisinopril which he has not stopped Change it to Norvasc No Dialysis due to his age Creat mildly worse Essential Hypertension Back pain Walking with his Stick Does have unstable gait No other red flags Is planning to go the beach in few weeks   Has history of macular degeneration.  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 Clearance Coots    DVT (deep venous thrombosis) (HCC)    Exudative age-related macular degeneration of right eye with active choroidal neovascularization (HCC)    Hypercholesterolemia    Hypertension    Hypothyroid    Kidney stone    Vitreous detachment, bilateral 03/15/2019    Past Surgical History:  Procedure Laterality Date   CATARACT EXTRACTION W/ INTRAOCULAR LENS IMPLANT Bilateral    COLONOSCOPY      Dr. Inocencio Homes, prior polyps, but last due to age   HEMORROIDECTOMY  30   Dr. Merrie Roof repair of right leg  Right    TOTAL HIP ARTHROPLASTY      Allergies  Allergen Reactions   Neosporin [Neomycin-Bacitracin Zn-Polymyx] Dermatitis   Penicillins Other (See Comments)    Reaction not recalled, but the "reaction was bad"    Outpatient Encounter Medications as of 09/14/2022  Medication Sig   amLODipine (NORVASC) 5 MG tablet Take 1 tablet (5 mg total) by mouth daily.   atorvastatin (LIPITOR) 40 MG tablet Take 1 tablet (40 mg total) by mouth daily.   levothyroxine (SYNTHROID) 100 MCG tablet TAKE 1 TABLET BY MOUTH EVERY DAY BEFORE BREAKFAST   Multiple Vitamins-Minerals (PRESERVISION AREDS PO) Take 1 capsule by mouth 2 (two) times a day.   rivaroxaban (XARELTO) 10 MG TABS tablet Take 1 tablet (10 mg total) by mouth daily.   [DISCONTINUED] lisinopril (ZESTRIL) 10 MG tablet TAKE 1 TABLET BY MOUTH EVERY DAY   acetaminophen (TYLENOL) 325 MG tablet Take 325-650 mg by mouth every 6 (six) hours as needed for mild pain or headache. (Patient not taking: Reported on 09/14/2022)   No facility-administered encounter medications on file as of 09/14/2022.    Review of Systems:  Review of Systems  Constitutional:  Negative for activity change, appetite change and unexpected weight change.  HENT: Negative.  Respiratory:  Negative for cough and shortness of breath.   Cardiovascular:  Negative for leg swelling.  Gastrointestinal:  Negative for constipation.  Genitourinary:  Negative for frequency.  Musculoskeletal:  Positive for gait problem. Negative for arthralgias and myalgias.  Skin: Negative.  Negative for rash.  Neurological:  Negative for dizziness and weakness.  Psychiatric/Behavioral:  Negative for confusion and sleep disturbance.   All other systems reviewed and are negative.   Health Maintenance  Topic Date Due   DTaP/Tdap/Td (1 - Tdap) Never done   Zoster Vaccines- Shingrix (1 of 2)  Never done   Medicare Annual Wellness (AWV)  12/06/2020   COVID-19 Vaccine (6 - 2023-24 season) 10/09/2021   INFLUENZA VACCINE  09/09/2022   Pneumonia Vaccine 66+ Years old  Completed   HPV VACCINES  Aged Out    Physical Exam: Vitals:   09/14/22 1028  BP: (!) 150/82  Pulse: 77  Resp: 18  Temp: 97.8 F (36.6 C)  TempSrc: Temporal  SpO2: 96%  Weight: 169 lb (76.7 kg)  Height: 6\' 5"  (1.956 m)   Body mass index is 20.04 kg/m. Physical Exam Vitals reviewed.  Constitutional:      Appearance: Normal appearance.  HENT:     Head: Normocephalic.     Nose: Nose normal.     Mouth/Throat:     Mouth: Mucous membranes are moist.     Pharynx: Oropharynx is clear.  Eyes:     Pupils: Pupils are equal, round, and reactive to light.  Cardiovascular:     Rate and Rhythm: Normal rate and regular rhythm.     Pulses: Normal pulses.     Heart sounds: No murmur heard. Pulmonary:     Effort: Pulmonary effort is normal. No respiratory distress.     Breath sounds: Normal breath sounds. No rales.  Abdominal:     General: Abdomen is flat. Bowel sounds are normal.     Palpations: Abdomen is soft.  Musculoskeletal:        General: No swelling.     Cervical back: Neck supple.  Skin:    General: Skin is warm.  Neurological:     General: No focal deficit present.     Mental Status: He is alert and oriented to person, place, and time.  Psychiatric:        Mood and Affect: Mood normal.        Thought Content: Thought content normal.     Labs reviewed: Basic Metabolic Panel: Recent Labs    11/24/21 0000 01/21/22 0000 03/19/22 1306 08/25/22 0000 09/07/22 0000  NA 140   < > 140 139 138  K 5.0   < > 4.3 4.6 4.5  CL 108   < > 108 108 109*  CO2 18   < > 19* 18 19  GLUCOSE  --   --  144*  --   --   BUN 46*   < > 52* 53* 51*  CREATININE 3.0*   < > 3.34* 3.4* 3.2*  CALCIUM 9.2   < > 9.5 8.6* 8.7  TSH 0.93  --   --   --  0.43   < > = values in this interval not displayed.   Liver  Function Tests: Recent Labs    11/24/21 0000 01/21/22 0000 03/19/22 1306 08/25/22 0000 09/07/22 0000  AST 18  --  13*  --  21  ALT 8*  --  11  --  15  ALKPHOS 113  --  89  --  113  BILITOT  --   --  0.5  --   --   PROT  --   --  7.2  --   --   ALBUMIN 4.2   < > 4.4 4.2 4.0   < > = values in this interval not displayed.   Recent Labs    03/19/22 1306  LIPASE 100*   No results for input(s): "AMMONIA" in the last 8760 hours. CBC: Recent Labs    03/19/22 1306 08/25/22 0000 09/07/22 0000  WBC 5.3 5.2 4.1  NEUTROABS 3.9 3.80  --   HGB 12.8* 12.6* 12.2*  HCT 37.7* 38* 35*  MCV 96.9  --   --   PLT 158 155 147*   Lipid Panel: Recent Labs    11/24/21 0000 09/07/22 0000  CHOL 271* 143  HDL 46 44  LDLCALC 194 76  TRIG 159 116   No results found for: "HGBA1C"  Procedures since last visit: No results found.  Assessment/Plan 1. Chronic kidney disease (CKD), stage IV (severe) (HCC) Follows with Nephrology No Dialysis No NSAIDS 2. Chronic bilateral low back pain without sciatica Use Voltaren gel Tylenol prn  3. DVT, lower extremity, recurrent, left (HCC) Xarelto low dose  4. Pure hypercholesterolemia Statin LDL 76  5. Essential hypertension Discontinue Lisinopril Start on Norvasc 6 . Acquired hypothyroidism  TSH normal 7  Lung nodule CT scan follow up next visit  Labs/tests ordered:   Next appt:  12/21/2022

## 2022-09-24 ENCOUNTER — Telehealth: Payer: Self-pay | Admitting: Adult Health

## 2022-09-24 ENCOUNTER — Other Ambulatory Visit: Payer: Self-pay | Admitting: Internal Medicine

## 2022-09-24 DIAGNOSIS — R319 Hematuria, unspecified: Secondary | ICD-10-CM

## 2022-09-24 MED ORDER — CIPROFLOXACIN HCL 250 MG PO TABS: 250.0000 mg | ORAL_TABLET | Freq: Every day | ORAL | 0 refills | Status: AC

## 2022-09-24 NOTE — Telephone Encounter (Signed)
Pt called nurse at [facility] to report blood in urine. He called his urologist and they can't fit him in. He is going out of town on Monday.  Feeling some low back pain as well. Nurse from [facility] will obtain a UA C and S stat. For worsening symptom she should go to the ER. He is going to get an apt with urology at the next available slot. Reports severe PCN allergy. Has stage IV CKD. Very limited options. Will try renal dose Cipro.

## 2022-09-27 ENCOUNTER — Telehealth: Payer: Self-pay | Admitting: Adult Health

## 2022-09-27 NOTE — Telephone Encounter (Signed)
No growth in urine culture. 3+ blood, 50 protein, brown clody 1.0111 negative for leukocyte esterase, negative nitrite.   OK to stop Cipro. Clinic nurse to communicate with patient.  Needs urology f/u.

## 2022-10-13 DIAGNOSIS — H35371 Puckering of macula, right eye: Secondary | ICD-10-CM | POA: Diagnosis not present

## 2022-10-13 DIAGNOSIS — H43813 Vitreous degeneration, bilateral: Secondary | ICD-10-CM | POA: Diagnosis not present

## 2022-10-13 DIAGNOSIS — H353211 Exudative age-related macular degeneration, right eye, with active choroidal neovascularization: Secondary | ICD-10-CM | POA: Diagnosis not present

## 2022-10-13 DIAGNOSIS — H43393 Other vitreous opacities, bilateral: Secondary | ICD-10-CM | POA: Diagnosis not present

## 2022-10-13 DIAGNOSIS — H33322 Round hole, left eye: Secondary | ICD-10-CM | POA: Diagnosis not present

## 2022-10-13 DIAGNOSIS — H35033 Hypertensive retinopathy, bilateral: Secondary | ICD-10-CM | POA: Diagnosis not present

## 2022-10-13 DIAGNOSIS — H353124 Nonexudative age-related macular degeneration, left eye, advanced atrophic with subfoveal involvement: Secondary | ICD-10-CM | POA: Diagnosis not present

## 2022-10-20 ENCOUNTER — Other Ambulatory Visit: Payer: Self-pay | Admitting: Internal Medicine

## 2022-10-25 ENCOUNTER — Other Ambulatory Visit: Payer: Self-pay | Admitting: Internal Medicine

## 2022-10-28 DIAGNOSIS — Z7901 Long term (current) use of anticoagulants: Secondary | ICD-10-CM | POA: Diagnosis not present

## 2022-10-28 DIAGNOSIS — R32 Unspecified urinary incontinence: Secondary | ICD-10-CM | POA: Diagnosis not present

## 2022-10-28 DIAGNOSIS — E039 Hypothyroidism, unspecified: Secondary | ICD-10-CM | POA: Diagnosis not present

## 2022-10-28 DIAGNOSIS — N529 Male erectile dysfunction, unspecified: Secondary | ICD-10-CM | POA: Diagnosis not present

## 2022-10-28 DIAGNOSIS — I129 Hypertensive chronic kidney disease with stage 1 through stage 4 chronic kidney disease, or unspecified chronic kidney disease: Secondary | ICD-10-CM | POA: Diagnosis not present

## 2022-10-28 DIAGNOSIS — M1990 Unspecified osteoarthritis, unspecified site: Secondary | ICD-10-CM | POA: Diagnosis not present

## 2022-10-28 DIAGNOSIS — N184 Chronic kidney disease, stage 4 (severe): Secondary | ICD-10-CM | POA: Diagnosis not present

## 2022-10-28 DIAGNOSIS — E785 Hyperlipidemia, unspecified: Secondary | ICD-10-CM | POA: Diagnosis not present

## 2022-10-28 DIAGNOSIS — R2681 Unsteadiness on feet: Secondary | ICD-10-CM | POA: Diagnosis not present

## 2022-11-15 DIAGNOSIS — H9211 Otorrhea, right ear: Secondary | ICD-10-CM | POA: Diagnosis not present

## 2022-11-15 DIAGNOSIS — H6122 Impacted cerumen, left ear: Secondary | ICD-10-CM | POA: Diagnosis not present

## 2022-11-15 DIAGNOSIS — H903 Sensorineural hearing loss, bilateral: Secondary | ICD-10-CM | POA: Diagnosis not present

## 2022-11-24 ENCOUNTER — Other Ambulatory Visit: Payer: Self-pay | Admitting: Internal Medicine

## 2022-11-25 ENCOUNTER — Other Ambulatory Visit: Payer: Self-pay | Admitting: Internal Medicine

## 2022-11-25 NOTE — Telephone Encounter (Signed)
Pharmacy comment: Alternative Requested:NOT COVERED.

## 2022-11-25 NOTE — Telephone Encounter (Signed)
Patient called to check the status of his refill.

## 2022-11-26 NOTE — Telephone Encounter (Signed)
Pharmacy comment: Alternative Requested:PRIOR AUTH REQUIRED

## 2022-12-03 DIAGNOSIS — H903 Sensorineural hearing loss, bilateral: Secondary | ICD-10-CM | POA: Diagnosis not present

## 2022-12-03 DIAGNOSIS — H90A22 Sensorineural hearing loss, unilateral, left ear, with restricted hearing on the contralateral side: Secondary | ICD-10-CM | POA: Diagnosis not present

## 2022-12-03 DIAGNOSIS — H90A31 Mixed conductive and sensorineural hearing loss, unilateral, right ear with restricted hearing on the contralateral side: Secondary | ICD-10-CM | POA: Diagnosis not present

## 2022-12-03 DIAGNOSIS — H6521 Chronic serous otitis media, right ear: Secondary | ICD-10-CM | POA: Diagnosis not present

## 2022-12-03 DIAGNOSIS — H9211 Otorrhea, right ear: Secondary | ICD-10-CM | POA: Diagnosis not present

## 2022-12-07 DIAGNOSIS — E785 Hyperlipidemia, unspecified: Secondary | ICD-10-CM | POA: Diagnosis not present

## 2022-12-07 DIAGNOSIS — N184 Chronic kidney disease, stage 4 (severe): Secondary | ICD-10-CM | POA: Diagnosis not present

## 2022-12-07 DIAGNOSIS — D631 Anemia in chronic kidney disease: Secondary | ICD-10-CM | POA: Diagnosis not present

## 2022-12-07 DIAGNOSIS — I129 Hypertensive chronic kidney disease with stage 1 through stage 4 chronic kidney disease, or unspecified chronic kidney disease: Secondary | ICD-10-CM | POA: Diagnosis not present

## 2022-12-07 DIAGNOSIS — N2581 Secondary hyperparathyroidism of renal origin: Secondary | ICD-10-CM | POA: Diagnosis not present

## 2022-12-07 LAB — BASIC METABOLIC PANEL
BUN: 53 — AB (ref 4–21)
CO2: 19 (ref 13–22)
Chloride: 110 — AB (ref 99–108)
Creatinine: 3.4 — AB (ref 0.6–1.3)
Glucose: 119
Potassium: 4.6 meq/L (ref 3.5–5.1)
Sodium: 139 (ref 137–147)

## 2022-12-07 LAB — IRON,TIBC AND FERRITIN PANEL
%SAT: 33
Ferritin: 162
Iron: 82
TIBC: 246
UIBC: 164

## 2022-12-07 LAB — COMPREHENSIVE METABOLIC PANEL
Albumin: 4 (ref 3.5–5.0)
Calcium: 8.5 — AB (ref 8.7–10.7)

## 2022-12-07 LAB — CBC AND DIFFERENTIAL: Hemoglobin: 12 — AB (ref 13.5–17.5)

## 2022-12-21 ENCOUNTER — Non-Acute Institutional Stay: Payer: Medicare PPO | Admitting: Internal Medicine

## 2022-12-21 ENCOUNTER — Encounter: Payer: Self-pay | Admitting: Internal Medicine

## 2022-12-21 VITALS — BP 144/80 | HR 81 | Temp 97.7°F | Resp 17 | Ht 77.0 in | Wt 168.8 lb

## 2022-12-21 DIAGNOSIS — M545 Low back pain, unspecified: Secondary | ICD-10-CM

## 2022-12-21 DIAGNOSIS — W19XXXA Unspecified fall, initial encounter: Secondary | ICD-10-CM

## 2022-12-21 DIAGNOSIS — R911 Solitary pulmonary nodule: Secondary | ICD-10-CM

## 2022-12-21 DIAGNOSIS — N184 Chronic kidney disease, stage 4 (severe): Secondary | ICD-10-CM | POA: Diagnosis not present

## 2022-12-21 DIAGNOSIS — R079 Chest pain, unspecified: Secondary | ICD-10-CM

## 2022-12-21 DIAGNOSIS — E039 Hypothyroidism, unspecified: Secondary | ICD-10-CM

## 2022-12-21 DIAGNOSIS — G8929 Other chronic pain: Secondary | ICD-10-CM

## 2022-12-21 DIAGNOSIS — E7800 Pure hypercholesterolemia, unspecified: Secondary | ICD-10-CM

## 2022-12-21 DIAGNOSIS — I10 Essential (primary) hypertension: Secondary | ICD-10-CM | POA: Diagnosis not present

## 2022-12-21 NOTE — Progress Notes (Signed)
Location: [facility] features editor of Service:  Clinic (12)  Provider:   Code Status: DNR Goals of Care:     12/21/2022   10:07 AM  Advanced Directives  Does Patient Have a Medical Advance Directive? Yes  Type of Estate agent of [place];Out of facility DNR (pink MOST or yellow form);Living will  Does patient want to make changes to medical advance directive? No - Patient declined  Copy of Healthcare Power of Attorney in Chart? Yes - validated most recent copy scanned in chart (See row information)     Chief Complaint  Patient presents with   Medical Management of Chronic Issues    Patient is being seen for a 3 month follow up . Patient had a fall over a wheelchair and hurt ribs    HPI: Patient is a 89 y.o. male seen today for an acute visit for Follow up also after having a fall Lives with his wife in [place] His daughter with him today    CKD Stage 4  No Dialysis due to his age Creat stable Essential Hypertension Suppose to be on Norvasc but not sure if he is taking it Back pain  Unstable Gait Walking with his Stick Had Fall recently over someone's Power chair Did not fall on the floor but on the Chair Since then Hurting some on Right Side But no SOB    Has history of macular degeneration.  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 Clearance Coots    DVT (deep venous thrombosis) (HCC)    Exudative age-related macular degeneration of right eye with active choroidal neovascularization (HCC)    Hypercholesterolemia    Hypertension    Hypothyroid    Kidney stone    Vitreous detachment, bilateral 03/15/2019    Past Surgical History:  Procedure Laterality Date   CATARACT EXTRACTION W/  INTRAOCULAR LENS IMPLANT Bilateral    COLONOSCOPY     Dr. Inocencio Homes, prior polyps, but last due to age   HEMORROIDECTOMY  48   Dr. Merrie Roof repair of right leg  Right    TOTAL HIP ARTHROPLASTY      Allergies  Allergen Reactions   Neosporin [Neomycin-Bacitracin Zn-Polymyx] Dermatitis   Penicillins Other (See Comments)    Reaction not recalled, but the "reaction was bad"    Outpatient Encounter Medications as of 12/21/2022  Medication Sig   acetaminophen (TYLENOL) 325 MG tablet Take 325-650 mg by mouth every 6 (six) hours as needed for mild pain (pain score 1-3) or headache.   amLODipine (NORVASC) 5 MG tablet Take 1 tablet (5 mg total) by mouth daily.   atorvastatin (LIPITOR) 40 MG tablet Take 1 tablet (40 mg total) by mouth daily.   levothyroxine (SYNTHROID) 100 MCG tablet TAKE 1 TABLET BY MOUTH EVERY DAY BEFORE BREAKFAST   Multiple Vitamins-Minerals (PRESERVISION AREDS PO) Take 1 capsule by mouth 2 (two) times a day.   XARELTO 10 MG TABS tablet TAKE 1 TABLET BY MOUTH EVERY DAY   No facility-administered encounter medications on file as of 12/21/2022.    Review of Systems:  Review of Systems  Constitutional:  Negative for activity change, appetite change and unexpected weight change.  HENT: Negative.    Respiratory:  Negative  for cough and shortness of breath.   Cardiovascular:  Positive for chest pain. Negative for leg swelling.  Gastrointestinal:  Negative for constipation.  Genitourinary:  Negative for frequency.  Musculoskeletal:  Positive for gait problem. Negative for arthralgias and myalgias.  Skin: Negative.  Negative for rash.  Neurological:  Positive for weakness. Negative for dizziness.  Psychiatric/Behavioral:  Negative for confusion and sleep disturbance.   All other systems reviewed and are negative.   Health Maintenance  Topic Date Due   DTaP/Tdap/Td (1 - Tdap) Never done   Zoster Vaccines- Shingrix (1 of 2) Never done   Medicare Annual Wellness  (AWV)  12/06/2020   INFLUENZA VACCINE  09/09/2022   COVID-19 Vaccine (6 - 2023-24 season) 10/10/2022   Pneumonia Vaccine 63+ Years old  Completed   HPV VACCINES  Aged Out    Physical Exam: Vitals:   12/21/22 1005  BP: (!) 144/80  Pulse: 81  Resp: 17  Temp: 97.7 F (36.5 C)  TempSrc: Temporal  SpO2: 96%  Weight: 168 lb 12.8 oz (76.6 kg)  Height: 6\' 5"  (1.956 m)   Body mass index is 20.02 kg/m. Physical Exam Vitals reviewed.  Constitutional:      Appearance: Normal appearance.  HENT:     Head: Normocephalic.     Nose: Nose normal.     Mouth/Throat:     Mouth: Mucous membranes are moist.     Pharynx: Oropharynx is clear.  Eyes:     Pupils: Pupils are equal, round, and reactive to light.  Cardiovascular:     Rate and Rhythm: Normal rate and regular rhythm.     Pulses: Normal pulses.     Heart sounds: No murmur heard. Pulmonary:     Effort: Pulmonary effort is normal. No respiratory distress.     Breath sounds: Normal breath sounds. No rales.  Abdominal:     General: Abdomen is flat. Bowel sounds are normal.     Palpations: Abdomen is soft.  Musculoskeletal:        General: No swelling.     Cervical back: Neck supple.  Skin:    General: Skin is warm.  Neurological:     General: No focal deficit present.     Mental Status: He is alert and oriented to person, place, and time.  Psychiatric:        Mood and Affect: Mood normal.        Thought Content: Thought content normal.     Labs reviewed: Basic Metabolic Panel: Recent Labs    03/19/22 1306 08/25/22 0000 09/07/22 0000 12/07/22 0000  NA 140 139 138 139  K 4.3 4.6 4.5 4.6  CL 108 108 109* 110*  CO2 19* 18 19 19   GLUCOSE 144*  --   --   --   BUN 52* 53* 51* 53*  CREATININE 3.34* 3.4* 3.2* 3.4*  CALCIUM 9.5 8.6* 8.7 8.5*  TSH  --   --  0.43  --    Liver Function Tests: Recent Labs    03/19/22 1306 08/25/22 0000 09/07/22 0000 12/07/22 0000  AST 13*  --  21  --   ALT 11  --  15  --   ALKPHOS  89  --  113  --   BILITOT 0.5  --   --   --   PROT 7.2  --   --   --   ALBUMIN 4.4 4.2 4.0 4.0   Recent Labs    03/19/22 1306  LIPASE 100*  No results for input(s): "AMMONIA" in the last 8760 hours. CBC: Recent Labs    03/19/22 1306 08/25/22 0000 09/07/22 0000 12/07/22 0000  WBC 5.3 5.2 4.1  --   NEUTROABS 3.9 3.80  --   --   HGB 12.8* 12.6* 12.2* 12.0*  HCT 37.7* 38* 35*  --   MCV 96.9  --   --   --   PLT 158 155 147*  --    Lipid Panel: Recent Labs    09/07/22 0000  CHOL 143  HDL 44  LDLCALC 76  TRIG 116   No results found for: "HGBA1C"  Procedures since last visit: No results found.  Assessment/Plan 1. Fall, initial encounter Therapy Eval and tret He has refused before but Daughter wants to try again Gait very unstable Needs Walker 2. Right-sided chest pain after fall Exam Negative Can use Tylenol PRN  Go to ED if Pain Worsen   3. Chronic kidney disease (CKD), stage IV (severe) (HCC) Follows with Renal Creat Stable  4. Chronic bilateral low back pain without sciatica Tylenol PRN  5. Essential hypertension ? Not sure taking his Norvasc BP is good  6. Acquired hypothyroidism TSH normal in 7/24  7. Pure hypercholesterolemia Lipitor  8. Lung nodule  - CT Chest Wo Contrast; Future 9 [facility] Daughter not sure if he is taking his meds ? Issue with his Memory ST eval  10DVT, lower extremity, recurrent, left (HCC) Xarelto low dose  11 Needs RSV,Covid,FLU,Shingrix,TDAP Informed Daughter  Labs/tests ordered:   Next appt:  Visit date not found

## 2022-12-21 NOTE — Patient Instructions (Addendum)
He needs Covid, Flu and RSV shots Also need Shingrix for Shingles Needs TDAP  I am making referral to Speech therapy and Physical therapy Take Tylenol PRN for pain in the chest If pain gets worse let us know Check if he takes Amlodipine

## 2022-12-23 DIAGNOSIS — H353211 Exudative age-related macular degeneration, right eye, with active choroidal neovascularization: Secondary | ICD-10-CM | POA: Diagnosis not present

## 2022-12-23 DIAGNOSIS — H43393 Other vitreous opacities, bilateral: Secondary | ICD-10-CM | POA: Diagnosis not present

## 2022-12-23 DIAGNOSIS — H33322 Round hole, left eye: Secondary | ICD-10-CM | POA: Diagnosis not present

## 2022-12-23 DIAGNOSIS — H43813 Vitreous degeneration, bilateral: Secondary | ICD-10-CM | POA: Diagnosis not present

## 2022-12-23 DIAGNOSIS — H35033 Hypertensive retinopathy, bilateral: Secondary | ICD-10-CM | POA: Diagnosis not present

## 2022-12-23 DIAGNOSIS — H353124 Nonexudative age-related macular degeneration, left eye, advanced atrophic with subfoveal involvement: Secondary | ICD-10-CM | POA: Diagnosis not present

## 2022-12-23 DIAGNOSIS — H35371 Puckering of macula, right eye: Secondary | ICD-10-CM | POA: Diagnosis not present

## 2022-12-28 DIAGNOSIS — R4789 Other speech disturbances: Secondary | ICD-10-CM | POA: Diagnosis not present

## 2022-12-28 DIAGNOSIS — R1312 Dysphagia, oropharyngeal phase: Secondary | ICD-10-CM | POA: Diagnosis not present

## 2022-12-28 DIAGNOSIS — R41841 Cognitive communication deficit: Secondary | ICD-10-CM | POA: Diagnosis not present

## 2022-12-28 DIAGNOSIS — R4189 Other symptoms and signs involving cognitive functions and awareness: Secondary | ICD-10-CM | POA: Diagnosis not present

## 2022-12-29 ENCOUNTER — Other Ambulatory Visit: Payer: Self-pay | Admitting: Internal Medicine

## 2022-12-29 DIAGNOSIS — Z9181 History of falling: Secondary | ICD-10-CM | POA: Diagnosis not present

## 2022-12-29 DIAGNOSIS — M62562 Muscle wasting and atrophy, not elsewhere classified, left lower leg: Secondary | ICD-10-CM | POA: Diagnosis not present

## 2022-12-29 DIAGNOSIS — R2689 Other abnormalities of gait and mobility: Secondary | ICD-10-CM | POA: Diagnosis not present

## 2022-12-29 DIAGNOSIS — M62561 Muscle wasting and atrophy, not elsewhere classified, right lower leg: Secondary | ICD-10-CM | POA: Diagnosis not present

## 2022-12-29 DIAGNOSIS — R2681 Unsteadiness on feet: Secondary | ICD-10-CM | POA: Diagnosis not present

## 2022-12-30 NOTE — Telephone Encounter (Signed)
Patient is requesting medication not on list.

## 2023-01-01 ENCOUNTER — Ambulatory Visit (HOSPITAL_BASED_OUTPATIENT_CLINIC_OR_DEPARTMENT_OTHER)
Admission: RE | Admit: 2023-01-01 | Discharge: 2023-01-01 | Disposition: A | Discharge status: Home / Self Care | Payer: Medicare PPO | Source: Ambulatory Visit | Attending: Internal Medicine | Admitting: Internal Medicine

## 2023-01-01 DIAGNOSIS — I7121 Aneurysm of the ascending aorta, without rupture: Secondary | ICD-10-CM | POA: Diagnosis not present

## 2023-01-01 DIAGNOSIS — R911 Solitary pulmonary nodule: Secondary | ICD-10-CM | POA: Diagnosis not present

## 2023-01-11 DIAGNOSIS — R2689 Other abnormalities of gait and mobility: Secondary | ICD-10-CM | POA: Diagnosis not present

## 2023-01-11 DIAGNOSIS — Z9181 History of falling: Secondary | ICD-10-CM | POA: Diagnosis not present

## 2023-01-11 DIAGNOSIS — M62561 Muscle wasting and atrophy, not elsewhere classified, right lower leg: Secondary | ICD-10-CM | POA: Diagnosis not present

## 2023-01-11 DIAGNOSIS — M62562 Muscle wasting and atrophy, not elsewhere classified, left lower leg: Secondary | ICD-10-CM | POA: Diagnosis not present

## 2023-01-11 DIAGNOSIS — R2681 Unsteadiness on feet: Secondary | ICD-10-CM | POA: Diagnosis not present

## 2023-01-12 DIAGNOSIS — R1312 Dysphagia, oropharyngeal phase: Secondary | ICD-10-CM | POA: Diagnosis not present

## 2023-01-12 DIAGNOSIS — R41841 Cognitive communication deficit: Secondary | ICD-10-CM | POA: Diagnosis not present

## 2023-01-12 DIAGNOSIS — R4189 Other symptoms and signs involving cognitive functions and awareness: Secondary | ICD-10-CM | POA: Diagnosis not present

## 2023-01-12 DIAGNOSIS — R4789 Other speech disturbances: Secondary | ICD-10-CM | POA: Diagnosis not present

## 2023-01-13 DIAGNOSIS — M62562 Muscle wasting and atrophy, not elsewhere classified, left lower leg: Secondary | ICD-10-CM | POA: Diagnosis not present

## 2023-01-13 DIAGNOSIS — M62561 Muscle wasting and atrophy, not elsewhere classified, right lower leg: Secondary | ICD-10-CM | POA: Diagnosis not present

## 2023-01-13 DIAGNOSIS — R2689 Other abnormalities of gait and mobility: Secondary | ICD-10-CM | POA: Diagnosis not present

## 2023-01-13 DIAGNOSIS — Z9181 History of falling: Secondary | ICD-10-CM | POA: Diagnosis not present

## 2023-01-13 DIAGNOSIS — R2681 Unsteadiness on feet: Secondary | ICD-10-CM | POA: Diagnosis not present

## 2023-01-14 DIAGNOSIS — M62562 Muscle wasting and atrophy, not elsewhere classified, left lower leg: Secondary | ICD-10-CM | POA: Diagnosis not present

## 2023-01-14 DIAGNOSIS — M62561 Muscle wasting and atrophy, not elsewhere classified, right lower leg: Secondary | ICD-10-CM | POA: Diagnosis not present

## 2023-01-14 DIAGNOSIS — Z9181 History of falling: Secondary | ICD-10-CM | POA: Diagnosis not present

## 2023-01-14 DIAGNOSIS — R2689 Other abnormalities of gait and mobility: Secondary | ICD-10-CM | POA: Diagnosis not present

## 2023-01-14 DIAGNOSIS — R2681 Unsteadiness on feet: Secondary | ICD-10-CM | POA: Diagnosis not present

## 2023-01-15 ENCOUNTER — Other Ambulatory Visit: Payer: Self-pay | Admitting: Internal Medicine

## 2023-01-17 DIAGNOSIS — R1312 Dysphagia, oropharyngeal phase: Secondary | ICD-10-CM | POA: Diagnosis not present

## 2023-01-17 DIAGNOSIS — R4189 Other symptoms and signs involving cognitive functions and awareness: Secondary | ICD-10-CM | POA: Diagnosis not present

## 2023-01-17 DIAGNOSIS — R4789 Other speech disturbances: Secondary | ICD-10-CM | POA: Diagnosis not present

## 2023-01-17 DIAGNOSIS — R41841 Cognitive communication deficit: Secondary | ICD-10-CM | POA: Diagnosis not present

## 2023-01-18 DIAGNOSIS — R2681 Unsteadiness on feet: Secondary | ICD-10-CM | POA: Diagnosis not present

## 2023-01-18 DIAGNOSIS — R2689 Other abnormalities of gait and mobility: Secondary | ICD-10-CM | POA: Diagnosis not present

## 2023-01-18 DIAGNOSIS — Z9181 History of falling: Secondary | ICD-10-CM | POA: Diagnosis not present

## 2023-01-18 DIAGNOSIS — M62562 Muscle wasting and atrophy, not elsewhere classified, left lower leg: Secondary | ICD-10-CM | POA: Diagnosis not present

## 2023-01-18 DIAGNOSIS — M62561 Muscle wasting and atrophy, not elsewhere classified, right lower leg: Secondary | ICD-10-CM | POA: Diagnosis not present

## 2023-01-19 DIAGNOSIS — R4189 Other symptoms and signs involving cognitive functions and awareness: Secondary | ICD-10-CM | POA: Diagnosis not present

## 2023-01-19 DIAGNOSIS — R41841 Cognitive communication deficit: Secondary | ICD-10-CM | POA: Diagnosis not present

## 2023-01-19 DIAGNOSIS — R1312 Dysphagia, oropharyngeal phase: Secondary | ICD-10-CM | POA: Diagnosis not present

## 2023-01-19 DIAGNOSIS — R4789 Other speech disturbances: Secondary | ICD-10-CM | POA: Diagnosis not present

## 2023-01-20 DIAGNOSIS — R2681 Unsteadiness on feet: Secondary | ICD-10-CM | POA: Diagnosis not present

## 2023-01-20 DIAGNOSIS — R2689 Other abnormalities of gait and mobility: Secondary | ICD-10-CM | POA: Diagnosis not present

## 2023-01-20 DIAGNOSIS — Z9181 History of falling: Secondary | ICD-10-CM | POA: Diagnosis not present

## 2023-01-20 DIAGNOSIS — M62562 Muscle wasting and atrophy, not elsewhere classified, left lower leg: Secondary | ICD-10-CM | POA: Diagnosis not present

## 2023-01-20 DIAGNOSIS — M62561 Muscle wasting and atrophy, not elsewhere classified, right lower leg: Secondary | ICD-10-CM | POA: Diagnosis not present

## 2023-01-21 DIAGNOSIS — R2689 Other abnormalities of gait and mobility: Secondary | ICD-10-CM | POA: Diagnosis not present

## 2023-01-21 DIAGNOSIS — M62561 Muscle wasting and atrophy, not elsewhere classified, right lower leg: Secondary | ICD-10-CM | POA: Diagnosis not present

## 2023-01-21 DIAGNOSIS — R2681 Unsteadiness on feet: Secondary | ICD-10-CM | POA: Diagnosis not present

## 2023-01-21 DIAGNOSIS — Z9181 History of falling: Secondary | ICD-10-CM | POA: Diagnosis not present

## 2023-01-21 DIAGNOSIS — M62562 Muscle wasting and atrophy, not elsewhere classified, left lower leg: Secondary | ICD-10-CM | POA: Diagnosis not present

## 2023-01-24 DIAGNOSIS — R4189 Other symptoms and signs involving cognitive functions and awareness: Secondary | ICD-10-CM | POA: Diagnosis not present

## 2023-01-24 DIAGNOSIS — R4789 Other speech disturbances: Secondary | ICD-10-CM | POA: Diagnosis not present

## 2023-01-24 DIAGNOSIS — R1312 Dysphagia, oropharyngeal phase: Secondary | ICD-10-CM | POA: Diagnosis not present

## 2023-01-24 DIAGNOSIS — R41841 Cognitive communication deficit: Secondary | ICD-10-CM | POA: Diagnosis not present

## 2023-01-25 DIAGNOSIS — M62561 Muscle wasting and atrophy, not elsewhere classified, right lower leg: Secondary | ICD-10-CM | POA: Diagnosis not present

## 2023-01-25 DIAGNOSIS — M62562 Muscle wasting and atrophy, not elsewhere classified, left lower leg: Secondary | ICD-10-CM | POA: Diagnosis not present

## 2023-01-25 DIAGNOSIS — R2689 Other abnormalities of gait and mobility: Secondary | ICD-10-CM | POA: Diagnosis not present

## 2023-01-25 DIAGNOSIS — R2681 Unsteadiness on feet: Secondary | ICD-10-CM | POA: Diagnosis not present

## 2023-01-25 DIAGNOSIS — Z9181 History of falling: Secondary | ICD-10-CM | POA: Diagnosis not present

## 2023-01-26 DIAGNOSIS — R1312 Dysphagia, oropharyngeal phase: Secondary | ICD-10-CM | POA: Diagnosis not present

## 2023-01-26 DIAGNOSIS — R4789 Other speech disturbances: Secondary | ICD-10-CM | POA: Diagnosis not present

## 2023-01-26 DIAGNOSIS — R4189 Other symptoms and signs involving cognitive functions and awareness: Secondary | ICD-10-CM | POA: Diagnosis not present

## 2023-01-26 DIAGNOSIS — R41841 Cognitive communication deficit: Secondary | ICD-10-CM | POA: Diagnosis not present

## 2023-02-07 DIAGNOSIS — R4789 Other speech disturbances: Secondary | ICD-10-CM | POA: Diagnosis not present

## 2023-02-07 DIAGNOSIS — R4189 Other symptoms and signs involving cognitive functions and awareness: Secondary | ICD-10-CM | POA: Diagnosis not present

## 2023-02-07 DIAGNOSIS — R1312 Dysphagia, oropharyngeal phase: Secondary | ICD-10-CM | POA: Diagnosis not present

## 2023-02-07 DIAGNOSIS — R41841 Cognitive communication deficit: Secondary | ICD-10-CM | POA: Diagnosis not present

## 2023-02-14 DIAGNOSIS — R41841 Cognitive communication deficit: Secondary | ICD-10-CM | POA: Diagnosis not present

## 2023-02-14 DIAGNOSIS — R1312 Dysphagia, oropharyngeal phase: Secondary | ICD-10-CM | POA: Diagnosis not present

## 2023-02-14 DIAGNOSIS — R4189 Other symptoms and signs involving cognitive functions and awareness: Secondary | ICD-10-CM | POA: Diagnosis not present

## 2023-02-14 DIAGNOSIS — R4789 Other speech disturbances: Secondary | ICD-10-CM | POA: Diagnosis not present

## 2023-02-16 DIAGNOSIS — R4789 Other speech disturbances: Secondary | ICD-10-CM | POA: Diagnosis not present

## 2023-02-16 DIAGNOSIS — R4189 Other symptoms and signs involving cognitive functions and awareness: Secondary | ICD-10-CM | POA: Diagnosis not present

## 2023-02-16 DIAGNOSIS — R1312 Dysphagia, oropharyngeal phase: Secondary | ICD-10-CM | POA: Diagnosis not present

## 2023-02-16 DIAGNOSIS — R41841 Cognitive communication deficit: Secondary | ICD-10-CM | POA: Diagnosis not present

## 2023-02-21 ENCOUNTER — Encounter: Payer: Medicare PPO | Admitting: Adult Health

## 2023-02-21 DIAGNOSIS — R4189 Other symptoms and signs involving cognitive functions and awareness: Secondary | ICD-10-CM | POA: Diagnosis not present

## 2023-02-21 DIAGNOSIS — R41841 Cognitive communication deficit: Secondary | ICD-10-CM | POA: Diagnosis not present

## 2023-02-21 DIAGNOSIS — R1312 Dysphagia, oropharyngeal phase: Secondary | ICD-10-CM | POA: Diagnosis not present

## 2023-02-21 DIAGNOSIS — R4789 Other speech disturbances: Secondary | ICD-10-CM | POA: Diagnosis not present

## 2023-02-23 DIAGNOSIS — R1312 Dysphagia, oropharyngeal phase: Secondary | ICD-10-CM | POA: Diagnosis not present

## 2023-02-23 DIAGNOSIS — R4789 Other speech disturbances: Secondary | ICD-10-CM | POA: Diagnosis not present

## 2023-02-23 DIAGNOSIS — R41841 Cognitive communication deficit: Secondary | ICD-10-CM | POA: Diagnosis not present

## 2023-02-23 DIAGNOSIS — R4189 Other symptoms and signs involving cognitive functions and awareness: Secondary | ICD-10-CM | POA: Diagnosis not present

## 2023-03-02 DIAGNOSIS — R1312 Dysphagia, oropharyngeal phase: Secondary | ICD-10-CM | POA: Diagnosis not present

## 2023-03-02 DIAGNOSIS — R4789 Other speech disturbances: Secondary | ICD-10-CM | POA: Diagnosis not present

## 2023-03-02 DIAGNOSIS — R4189 Other symptoms and signs involving cognitive functions and awareness: Secondary | ICD-10-CM | POA: Diagnosis not present

## 2023-03-02 DIAGNOSIS — R41841 Cognitive communication deficit: Secondary | ICD-10-CM | POA: Diagnosis not present

## 2023-03-03 DIAGNOSIS — H33322 Round hole, left eye: Secondary | ICD-10-CM | POA: Diagnosis not present

## 2023-03-03 DIAGNOSIS — H43393 Other vitreous opacities, bilateral: Secondary | ICD-10-CM | POA: Diagnosis not present

## 2023-03-03 DIAGNOSIS — H353134 Nonexudative age-related macular degeneration, bilateral, advanced atrophic with subfoveal involvement: Secondary | ICD-10-CM | POA: Diagnosis not present

## 2023-03-03 DIAGNOSIS — H35033 Hypertensive retinopathy, bilateral: Secondary | ICD-10-CM | POA: Diagnosis not present

## 2023-03-03 DIAGNOSIS — H35371 Puckering of macula, right eye: Secondary | ICD-10-CM | POA: Diagnosis not present

## 2023-03-03 DIAGNOSIS — H43813 Vitreous degeneration, bilateral: Secondary | ICD-10-CM | POA: Diagnosis not present

## 2023-03-03 DIAGNOSIS — H353211 Exudative age-related macular degeneration, right eye, with active choroidal neovascularization: Secondary | ICD-10-CM | POA: Diagnosis not present

## 2023-03-07 DIAGNOSIS — R1312 Dysphagia, oropharyngeal phase: Secondary | ICD-10-CM | POA: Diagnosis not present

## 2023-03-07 DIAGNOSIS — R4789 Other speech disturbances: Secondary | ICD-10-CM | POA: Diagnosis not present

## 2023-03-07 DIAGNOSIS — R4189 Other symptoms and signs involving cognitive functions and awareness: Secondary | ICD-10-CM | POA: Diagnosis not present

## 2023-03-07 DIAGNOSIS — R41841 Cognitive communication deficit: Secondary | ICD-10-CM | POA: Diagnosis not present

## 2023-03-09 DIAGNOSIS — R1312 Dysphagia, oropharyngeal phase: Secondary | ICD-10-CM | POA: Diagnosis not present

## 2023-03-09 DIAGNOSIS — R4189 Other symptoms and signs involving cognitive functions and awareness: Secondary | ICD-10-CM | POA: Diagnosis not present

## 2023-03-09 DIAGNOSIS — R4789 Other speech disturbances: Secondary | ICD-10-CM | POA: Diagnosis not present

## 2023-03-09 DIAGNOSIS — R41841 Cognitive communication deficit: Secondary | ICD-10-CM | POA: Diagnosis not present

## 2023-03-14 DIAGNOSIS — R41841 Cognitive communication deficit: Secondary | ICD-10-CM | POA: Diagnosis not present

## 2023-03-14 DIAGNOSIS — R4189 Other symptoms and signs involving cognitive functions and awareness: Secondary | ICD-10-CM | POA: Diagnosis not present

## 2023-03-14 DIAGNOSIS — R4789 Other speech disturbances: Secondary | ICD-10-CM | POA: Diagnosis not present

## 2023-03-14 DIAGNOSIS — R1312 Dysphagia, oropharyngeal phase: Secondary | ICD-10-CM | POA: Diagnosis not present

## 2023-03-16 DIAGNOSIS — R41841 Cognitive communication deficit: Secondary | ICD-10-CM | POA: Diagnosis not present

## 2023-03-16 DIAGNOSIS — R4789 Other speech disturbances: Secondary | ICD-10-CM | POA: Diagnosis not present

## 2023-03-16 DIAGNOSIS — R1312 Dysphagia, oropharyngeal phase: Secondary | ICD-10-CM | POA: Diagnosis not present

## 2023-03-16 DIAGNOSIS — R4189 Other symptoms and signs involving cognitive functions and awareness: Secondary | ICD-10-CM | POA: Diagnosis not present

## 2023-03-21 DIAGNOSIS — N2581 Secondary hyperparathyroidism of renal origin: Secondary | ICD-10-CM | POA: Diagnosis not present

## 2023-03-21 DIAGNOSIS — E039 Hypothyroidism, unspecified: Secondary | ICD-10-CM | POA: Diagnosis not present

## 2023-03-21 DIAGNOSIS — E785 Hyperlipidemia, unspecified: Secondary | ICD-10-CM | POA: Diagnosis not present

## 2023-03-21 DIAGNOSIS — N184 Chronic kidney disease, stage 4 (severe): Secondary | ICD-10-CM | POA: Diagnosis not present

## 2023-03-21 DIAGNOSIS — I129 Hypertensive chronic kidney disease with stage 1 through stage 4 chronic kidney disease, or unspecified chronic kidney disease: Secondary | ICD-10-CM | POA: Diagnosis not present

## 2023-03-23 DIAGNOSIS — H6123 Impacted cerumen, bilateral: Secondary | ICD-10-CM | POA: Diagnosis not present

## 2023-03-23 DIAGNOSIS — R4189 Other symptoms and signs involving cognitive functions and awareness: Secondary | ICD-10-CM | POA: Diagnosis not present

## 2023-03-23 DIAGNOSIS — R41841 Cognitive communication deficit: Secondary | ICD-10-CM | POA: Diagnosis not present

## 2023-03-23 DIAGNOSIS — R1312 Dysphagia, oropharyngeal phase: Secondary | ICD-10-CM | POA: Diagnosis not present

## 2023-03-23 DIAGNOSIS — Z8669 Personal history of other diseases of the nervous system and sense organs: Secondary | ICD-10-CM | POA: Diagnosis not present

## 2023-03-23 DIAGNOSIS — H6990 Unspecified Eustachian tube disorder, unspecified ear: Secondary | ICD-10-CM | POA: Diagnosis not present

## 2023-03-23 DIAGNOSIS — H903 Sensorineural hearing loss, bilateral: Secondary | ICD-10-CM | POA: Diagnosis not present

## 2023-03-23 DIAGNOSIS — R4789 Other speech disturbances: Secondary | ICD-10-CM | POA: Diagnosis not present

## 2023-03-26 ENCOUNTER — Other Ambulatory Visit: Payer: Self-pay | Admitting: Orthopedic Surgery

## 2023-03-28 DIAGNOSIS — R41841 Cognitive communication deficit: Secondary | ICD-10-CM | POA: Diagnosis not present

## 2023-03-28 DIAGNOSIS — R4789 Other speech disturbances: Secondary | ICD-10-CM | POA: Diagnosis not present

## 2023-03-28 DIAGNOSIS — R4189 Other symptoms and signs involving cognitive functions and awareness: Secondary | ICD-10-CM | POA: Diagnosis not present

## 2023-03-28 DIAGNOSIS — R1312 Dysphagia, oropharyngeal phase: Secondary | ICD-10-CM | POA: Diagnosis not present

## 2023-03-29 ENCOUNTER — Ambulatory Visit (HOSPITAL_BASED_OUTPATIENT_CLINIC_OR_DEPARTMENT_OTHER)
Admission: RE | Admit: 2023-03-29 | Discharge: 2023-03-29 | Disposition: A | Discharge status: Home / Self Care | Payer: Medicare PPO | Source: Ambulatory Visit | Attending: Internal Medicine | Admitting: Internal Medicine

## 2023-03-29 ENCOUNTER — Other Ambulatory Visit: Payer: Self-pay | Admitting: Internal Medicine

## 2023-03-29 DIAGNOSIS — M25551 Pain in right hip: Secondary | ICD-10-CM

## 2023-03-29 DIAGNOSIS — S72111A Displaced fracture of greater trochanter of right femur, initial encounter for closed fracture: Secondary | ICD-10-CM | POA: Diagnosis not present

## 2023-03-29 DIAGNOSIS — W19XXXA Unspecified fall, initial encounter: Secondary | ICD-10-CM

## 2023-03-29 DIAGNOSIS — Z96641 Presence of right artificial hip joint: Secondary | ICD-10-CM | POA: Insufficient documentation

## 2023-03-29 NOTE — Progress Notes (Signed)
Clinic Nurse called. Patient fell a week ago and now having pain in his Right Hip He has big Bruise on his Hip area per Nurse Will Order Hip Xray

## 2023-03-30 DIAGNOSIS — R41841 Cognitive communication deficit: Secondary | ICD-10-CM | POA: Diagnosis not present

## 2023-03-30 DIAGNOSIS — R1312 Dysphagia, oropharyngeal phase: Secondary | ICD-10-CM | POA: Diagnosis not present

## 2023-03-30 DIAGNOSIS — R4789 Other speech disturbances: Secondary | ICD-10-CM | POA: Diagnosis not present

## 2023-03-30 DIAGNOSIS — R4189 Other symptoms and signs involving cognitive functions and awareness: Secondary | ICD-10-CM | POA: Diagnosis not present

## 2023-04-04 DIAGNOSIS — R4789 Other speech disturbances: Secondary | ICD-10-CM | POA: Diagnosis not present

## 2023-04-04 DIAGNOSIS — R41841 Cognitive communication deficit: Secondary | ICD-10-CM | POA: Diagnosis not present

## 2023-04-04 DIAGNOSIS — R1312 Dysphagia, oropharyngeal phase: Secondary | ICD-10-CM | POA: Diagnosis not present

## 2023-04-04 DIAGNOSIS — R4189 Other symptoms and signs involving cognitive functions and awareness: Secondary | ICD-10-CM | POA: Diagnosis not present

## 2023-04-06 DIAGNOSIS — R41841 Cognitive communication deficit: Secondary | ICD-10-CM | POA: Diagnosis not present

## 2023-04-06 DIAGNOSIS — R4789 Other speech disturbances: Secondary | ICD-10-CM | POA: Diagnosis not present

## 2023-04-06 DIAGNOSIS — R4189 Other symptoms and signs involving cognitive functions and awareness: Secondary | ICD-10-CM | POA: Diagnosis not present

## 2023-04-06 DIAGNOSIS — R1312 Dysphagia, oropharyngeal phase: Secondary | ICD-10-CM | POA: Diagnosis not present

## 2023-04-11 DIAGNOSIS — R1312 Dysphagia, oropharyngeal phase: Secondary | ICD-10-CM | POA: Diagnosis not present

## 2023-04-11 DIAGNOSIS — R4789 Other speech disturbances: Secondary | ICD-10-CM | POA: Diagnosis not present

## 2023-04-11 DIAGNOSIS — R41841 Cognitive communication deficit: Secondary | ICD-10-CM | POA: Diagnosis not present

## 2023-04-11 DIAGNOSIS — R4189 Other symptoms and signs involving cognitive functions and awareness: Secondary | ICD-10-CM | POA: Diagnosis not present

## 2023-04-13 DIAGNOSIS — R41841 Cognitive communication deficit: Secondary | ICD-10-CM | POA: Diagnosis not present

## 2023-04-13 DIAGNOSIS — R4189 Other symptoms and signs involving cognitive functions and awareness: Secondary | ICD-10-CM | POA: Diagnosis not present

## 2023-04-13 DIAGNOSIS — R1312 Dysphagia, oropharyngeal phase: Secondary | ICD-10-CM | POA: Diagnosis not present

## 2023-04-13 DIAGNOSIS — R4789 Other speech disturbances: Secondary | ICD-10-CM | POA: Diagnosis not present

## 2023-04-18 ENCOUNTER — Telehealth: Payer: Self-pay

## 2023-04-18 ENCOUNTER — Other Ambulatory Visit: Payer: Self-pay | Admitting: Internal Medicine

## 2023-04-18 DIAGNOSIS — S72101A Unspecified trochanteric fracture of right femur, initial encounter for closed fracture: Secondary | ICD-10-CM

## 2023-04-18 NOTE — Progress Notes (Signed)
 Patient refused to go to ED for further Eval I have made Urgent referral to Ortho

## 2023-04-18 NOTE — Telephone Encounter (Signed)
 Stanley Gammon, MD 04/17/2023  5:46 PM EDT     I have called [facility] nurse to go and Check on him to see if he is having Pain. If he is then he needs to go to ED otherwise I will send him to Ortho tomorrow

## 2023-04-18 NOTE — Telephone Encounter (Signed)
 [facility] Radiology called with critical report for patient. She states patient hip and pelvis report right hip hemi arthroplasty with displaced fracture fragment involving the greater trochanter which a appears acute. Message routed to PCP Mahlon Gammon, MD and Medical Assistant in [place] just incase they call back.

## 2023-04-20 DIAGNOSIS — R4789 Other speech disturbances: Secondary | ICD-10-CM | POA: Diagnosis not present

## 2023-04-20 DIAGNOSIS — R1312 Dysphagia, oropharyngeal phase: Secondary | ICD-10-CM | POA: Diagnosis not present

## 2023-04-20 DIAGNOSIS — R41841 Cognitive communication deficit: Secondary | ICD-10-CM | POA: Diagnosis not present

## 2023-04-20 DIAGNOSIS — R4189 Other symptoms and signs involving cognitive functions and awareness: Secondary | ICD-10-CM | POA: Diagnosis not present

## 2023-04-21 DIAGNOSIS — M25551 Pain in right hip: Secondary | ICD-10-CM | POA: Diagnosis not present

## 2023-04-21 DIAGNOSIS — Z96641 Presence of right artificial hip joint: Secondary | ICD-10-CM | POA: Diagnosis not present

## 2023-04-25 ENCOUNTER — Ambulatory Visit: Payer: Medicare PPO | Admitting: Adult Health

## 2023-04-26 DIAGNOSIS — I5022 Chronic systolic (congestive) heart failure: Secondary | ICD-10-CM | POA: Diagnosis not present

## 2023-04-26 DIAGNOSIS — E039 Hypothyroidism, unspecified: Secondary | ICD-10-CM | POA: Diagnosis not present

## 2023-04-26 DIAGNOSIS — I10 Essential (primary) hypertension: Secondary | ICD-10-CM | POA: Diagnosis not present

## 2023-04-26 LAB — CBC AND DIFFERENTIAL
HCT: 33 — AB (ref 41–53)
HCT: 33 — AB (ref 41–53)
Hemoglobin: 11.3 — AB (ref 13.5–17.5)
Hemoglobin: 11.3 — AB (ref 13.5–17.5)
Platelets: 179 10*3/uL (ref 150–400)
WBC: 4.1
WBC: 4.1

## 2023-04-26 LAB — COMPREHENSIVE METABOLIC PANEL
Albumin: 4.1 (ref 3.5–5.0)
Calcium: 8.6 — AB (ref 8.7–10.7)
Globulin: 2.1
eGFR: 15

## 2023-04-26 LAB — BASIC METABOLIC PANEL
BUN: 57 — AB (ref 4–21)
CO2: 17 (ref 13–22)
Chloride: 104 (ref 99–108)
Creatinine: 3.7 — AB (ref 0.6–1.3)
Glucose: 96
Potassium: 4.5 meq/L (ref 3.5–5.1)
Sodium: 133 — AB (ref 137–147)

## 2023-04-26 LAB — LIPID PANEL
Cholesterol: 148 (ref 0–200)
HDL: 52 (ref 35–70)
LDL Cholesterol: 74
LDl/HDL Ratio: 2.8
Triglycerides: 107 (ref 40–160)

## 2023-04-26 LAB — HEPATIC FUNCTION PANEL
ALT: 22 U/L (ref 10–40)
AST: 19 (ref 14–40)
Alkaline Phosphatase: 167 — AB (ref 25–125)
Bilirubin, Total: 0.3

## 2023-04-26 LAB — CBC
RBC: 3.38 — AB (ref 3.87–5.11)
RBC: 3.38 — AB (ref 3.87–5.11)

## 2023-04-26 LAB — TSH: TSH: 2.14 (ref 0.41–5.90)

## 2023-04-28 DIAGNOSIS — E785 Hyperlipidemia, unspecified: Secondary | ICD-10-CM | POA: Diagnosis not present

## 2023-04-28 DIAGNOSIS — N184 Chronic kidney disease, stage 4 (severe): Secondary | ICD-10-CM | POA: Diagnosis not present

## 2023-04-28 LAB — CBC AND DIFFERENTIAL
HCT: 33 — AB (ref 41–53)
Hemoglobin: 11.1 — AB (ref 13.5–17.5)
Platelets: 197 10*3/uL (ref 150–400)
WBC: 5

## 2023-04-28 LAB — LIPID PANEL
Cholesterol: 159 (ref 0–200)
HDL: 47 (ref 35–70)
LDL Cholesterol: 82
Triglycerides: 151 (ref 40–160)

## 2023-04-28 LAB — HEPATIC FUNCTION PANEL
ALT: 21 U/L (ref 10–40)
AST: 21 (ref 14–40)
Alkaline Phosphatase: 159 — AB (ref 25–125)

## 2023-04-28 LAB — BASIC METABOLIC PANEL
BUN: 56 — AB (ref 4–21)
CO2: 16 (ref 13–22)
Chloride: 108 (ref 99–108)
Glucose: 90
Potassium: 4.8 meq/L (ref 3.5–5.1)
Sodium: 138 (ref 137–147)

## 2023-04-28 LAB — COMPREHENSIVE METABOLIC PANEL
Albumin: 4.2 (ref 3.5–5.0)
Calcium: 8.7 (ref 8.7–10.7)
Globulin: 2.5

## 2023-04-28 LAB — CBC: RBC: 3.34 — AB (ref 3.87–5.11)

## 2023-05-06 DIAGNOSIS — M25551 Pain in right hip: Secondary | ICD-10-CM | POA: Diagnosis not present

## 2023-05-24 DIAGNOSIS — H6123 Impacted cerumen, bilateral: Secondary | ICD-10-CM | POA: Diagnosis not present

## 2023-05-24 DIAGNOSIS — H903 Sensorineural hearing loss, bilateral: Secondary | ICD-10-CM | POA: Diagnosis not present

## 2023-05-25 DIAGNOSIS — H353211 Exudative age-related macular degeneration, right eye, with active choroidal neovascularization: Secondary | ICD-10-CM | POA: Diagnosis not present

## 2023-05-25 DIAGNOSIS — H33322 Round hole, left eye: Secondary | ICD-10-CM | POA: Diagnosis not present

## 2023-05-25 DIAGNOSIS — H43393 Other vitreous opacities, bilateral: Secondary | ICD-10-CM | POA: Diagnosis not present

## 2023-05-25 DIAGNOSIS — H43813 Vitreous degeneration, bilateral: Secondary | ICD-10-CM | POA: Diagnosis not present

## 2023-05-25 DIAGNOSIS — H35371 Puckering of macula, right eye: Secondary | ICD-10-CM | POA: Diagnosis not present

## 2023-05-25 DIAGNOSIS — H353134 Nonexudative age-related macular degeneration, bilateral, advanced atrophic with subfoveal involvement: Secondary | ICD-10-CM | POA: Diagnosis not present

## 2023-05-25 DIAGNOSIS — H35033 Hypertensive retinopathy, bilateral: Secondary | ICD-10-CM | POA: Diagnosis not present

## 2023-05-26 ENCOUNTER — Emergency Department (HOSPITAL_COMMUNITY)
Admission: EM | Admit: 2023-05-26 | Discharge: 2023-05-26 | Disposition: A | Discharge status: Home / Self Care | Attending: Emergency Medicine | Admitting: Emergency Medicine

## 2023-05-26 ENCOUNTER — Emergency Department (HOSPITAL_COMMUNITY)

## 2023-05-26 ENCOUNTER — Other Ambulatory Visit: Payer: Self-pay

## 2023-05-26 ENCOUNTER — Encounter (HOSPITAL_COMMUNITY): Payer: Self-pay

## 2023-05-26 DIAGNOSIS — R0902 Hypoxemia: Secondary | ICD-10-CM | POA: Diagnosis not present

## 2023-05-26 DIAGNOSIS — W19XXXA Unspecified fall, initial encounter: Secondary | ICD-10-CM | POA: Diagnosis not present

## 2023-05-26 DIAGNOSIS — R58 Hemorrhage, not elsewhere classified: Secondary | ICD-10-CM | POA: Diagnosis not present

## 2023-05-26 DIAGNOSIS — S50311A Abrasion of right elbow, initial encounter: Secondary | ICD-10-CM | POA: Insufficient documentation

## 2023-05-26 DIAGNOSIS — I6381 Other cerebral infarction due to occlusion or stenosis of small artery: Secondary | ICD-10-CM | POA: Diagnosis not present

## 2023-05-26 DIAGNOSIS — W07XXXA Fall from chair, initial encounter: Secondary | ICD-10-CM | POA: Insufficient documentation

## 2023-05-26 DIAGNOSIS — I129 Hypertensive chronic kidney disease with stage 1 through stage 4 chronic kidney disease, or unspecified chronic kidney disease: Secondary | ICD-10-CM | POA: Diagnosis not present

## 2023-05-26 DIAGNOSIS — R55 Syncope and collapse: Secondary | ICD-10-CM | POA: Diagnosis not present

## 2023-05-26 DIAGNOSIS — Z86718 Personal history of other venous thrombosis and embolism: Secondary | ICD-10-CM | POA: Insufficient documentation

## 2023-05-26 DIAGNOSIS — N184 Chronic kidney disease, stage 4 (severe): Secondary | ICD-10-CM | POA: Diagnosis not present

## 2023-05-26 DIAGNOSIS — S0991XA Unspecified injury of ear, initial encounter: Secondary | ICD-10-CM | POA: Diagnosis present

## 2023-05-26 DIAGNOSIS — E039 Hypothyroidism, unspecified: Secondary | ICD-10-CM | POA: Diagnosis not present

## 2023-05-26 DIAGNOSIS — Z79899 Other long term (current) drug therapy: Secondary | ICD-10-CM | POA: Diagnosis not present

## 2023-05-26 DIAGNOSIS — Y9389 Activity, other specified: Secondary | ICD-10-CM | POA: Insufficient documentation

## 2023-05-26 DIAGNOSIS — R42 Dizziness and giddiness: Secondary | ICD-10-CM | POA: Diagnosis not present

## 2023-05-26 DIAGNOSIS — Z7901 Long term (current) use of anticoagulants: Secondary | ICD-10-CM | POA: Insufficient documentation

## 2023-05-26 DIAGNOSIS — S0990XA Unspecified injury of head, initial encounter: Secondary | ICD-10-CM | POA: Insufficient documentation

## 2023-05-26 DIAGNOSIS — S01311A Laceration without foreign body of right ear, initial encounter: Secondary | ICD-10-CM | POA: Insufficient documentation

## 2023-05-26 DIAGNOSIS — S199XXA Unspecified injury of neck, initial encounter: Secondary | ICD-10-CM | POA: Diagnosis not present

## 2023-05-26 DIAGNOSIS — G319 Degenerative disease of nervous system, unspecified: Secondary | ICD-10-CM | POA: Diagnosis not present

## 2023-05-26 LAB — CBC
HCT: 34.2 % — ABNORMAL LOW (ref 39.0–52.0)
Hemoglobin: 11.4 g/dL — ABNORMAL LOW (ref 13.0–17.0)
MCH: 33.3 pg (ref 26.0–34.0)
MCHC: 33.3 g/dL (ref 30.0–36.0)
MCV: 100 fL (ref 80.0–100.0)
Platelets: 149 10*3/uL — ABNORMAL LOW (ref 150–400)
RBC: 3.42 MIL/uL — ABNORMAL LOW (ref 4.22–5.81)
RDW: 12.8 % (ref 11.5–15.5)
WBC: 4.7 10*3/uL (ref 4.0–10.5)
nRBC: 0 % (ref 0.0–0.2)

## 2023-05-26 LAB — COMPREHENSIVE METABOLIC PANEL WITH GFR
ALT: 21 U/L (ref 0–44)
AST: 20 U/L (ref 15–41)
Albumin: 3.9 g/dL (ref 3.5–5.0)
Alkaline Phosphatase: 97 U/L (ref 38–126)
Anion gap: 11 (ref 5–15)
BUN: 63 mg/dL — ABNORMAL HIGH (ref 8–23)
CO2: 15 mmol/L — ABNORMAL LOW (ref 22–32)
Calcium: 8.5 mg/dL — ABNORMAL LOW (ref 8.9–10.3)
Chloride: 110 mmol/L (ref 98–111)
Creatinine, Ser: 3.9 mg/dL — ABNORMAL HIGH (ref 0.61–1.24)
GFR, Estimated: 13 mL/min — ABNORMAL LOW (ref >60)
Glucose, Bld: 117 mg/dL — ABNORMAL HIGH (ref 70–99)
Potassium: 4.8 mmol/L (ref 3.5–5.1)
Sodium: 136 mmol/L (ref 135–145)
Total Bilirubin: 0.3 mg/dL (ref 0.0–1.2)
Total Protein: 6.6 g/dL (ref 6.5–8.1)

## 2023-05-26 LAB — URINALYSIS, ROUTINE W REFLEX MICROSCOPIC
Bacteria, UA: NONE SEEN
Bilirubin Urine: NEGATIVE
Glucose, UA: NEGATIVE mg/dL
Hgb urine dipstick: NEGATIVE
Ketones, ur: NEGATIVE mg/dL
Leukocytes,Ua: NEGATIVE
Nitrite: NEGATIVE
Protein, ur: 30 mg/dL — AB
Specific Gravity, Urine: 1.009 (ref 1.005–1.030)
pH: 5 (ref 5.0–8.0)

## 2023-05-26 LAB — I-STAT CHEM 8, ED
BUN: 76 mg/dL — ABNORMAL HIGH (ref 8–23)
Calcium, Ion: 0.92 mmol/L — ABNORMAL LOW (ref 1.15–1.40)
Chloride: 115 mmol/L — ABNORMAL HIGH (ref 98–111)
Creatinine, Ser: 4.3 mg/dL — ABNORMAL HIGH (ref 0.61–1.24)
Glucose, Bld: 112 mg/dL — ABNORMAL HIGH (ref 70–99)
HCT: 33 % — ABNORMAL LOW (ref 39.0–52.0)
Hemoglobin: 11.2 g/dL — ABNORMAL LOW (ref 13.0–17.0)
Potassium: 5.2 mmol/L — ABNORMAL HIGH (ref 3.5–5.1)
Sodium: 135 mmol/L (ref 135–145)
TCO2: 16 mmol/L — ABNORMAL LOW (ref 22–32)

## 2023-05-26 LAB — SAMPLE TO BLOOD BANK

## 2023-05-26 LAB — PROTIME-INR
INR: 1 (ref 0.8–1.2)
Prothrombin Time: 13.3 s (ref 11.4–15.2)

## 2023-05-26 LAB — I-STAT CG4 LACTIC ACID, ED: Lactic Acid, Venous: 1.1 mmol/L (ref 0.5–1.9)

## 2023-05-26 LAB — ETHANOL: Alcohol, Ethyl (B): <10 mg/dL (ref <10)

## 2023-05-26 MED ORDER — LIDOCAINE HCL (PF) 1 % IJ SOLN
30.0000 mL | Freq: Once | INTRAMUSCULAR | Status: AC
  Administered 2023-05-26: 30 mL
  Filled 2023-05-26: qty 30

## 2023-05-26 MED ORDER — TETANUS-DIPHTH-ACELL PERTUSSIS 5-2.5-18.5 LF-MCG/0.5 IM SUSY: 0.5000 mL | PREFILLED_SYRINGE | Freq: Once | INTRAMUSCULAR | Status: DC

## 2023-05-26 NOTE — ED Notes (Signed)
..  Trauma Response Nurse Documentation   Stanley Brooks is a 89 y.o. male arriving to [facility] ED via EMS  On Xarelto (rivaroxaban) daily. Trauma was activated as a Level 2 by ED Charge RN based on the following trauma criteria Elderly patients > 65 with head trauma on anti-coagulation (excluding ASA).  Patient cleared for CT by Dr. Nolia Baumgartner. Pt transported to CT with trauma response nurse present to monitor. RN remained with the patient throughout their absence from the department for clinical observation.   GCS 15.    History   Past Medical History:  Diagnosis Date   Cataract 2008   left and right eyde By Dr. 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 Armandina Lana, prior polyps, but last due to age   HEMORROIDECTOMY  24   Dr. Hale Level repair of right leg  Right    TOTAL HIP ARTHROPLASTY         Initial Focused Assessment (If applicable, or please see trauma documentation): Please see trauma documentation.  CT's Completed:   CT Head and CT C-Spine   Interventions:  Trauma lab draw CTH, C-spine CXR  Plan for disposition:  Pending at this time  Consults completed:  None at this time  Event Summary: Pt is a 89 y/o M BIB EMS after falling from chair while playing bridge with friends. Pt takes Xarelto. Pt denied LOC and reported feeling dizzy earlier in the day, but not preceding the fall. Pt arrived ABCs intact, GCS 15 w/o HC in place. 18 g in L FA by EMS. EDP and resident at bedside on pt arrival. Pt hypertensive w/ SBP 180s-190s, EMS reported the same. Lacs x2 noted to R temple/eyebrow, no bleeding. Pt w/  continuous oozing from R ear, direct pressure applied followed by dry dressing. Pt w/ wound to R elbow that was bandaged on scene and new bruisng to R hand noted. Pt denies any pain; full ROM. Pt cleared for CT. iSTAT resulted w/ Cr 4.3, but all scans were noncon. Pt taken to CT on the monitor, VSS unchanged. Returned to Trauma B.      MTP Summary (If applicable):   Bedside handoff with ED RN Crystal. Notified that pt remains hypertensive.   Stanley Brooks  Trauma Response RN  Please call TRN at [phone] for further assistance.

## 2023-05-26 NOTE — Discharge Instructions (Addendum)
 You were seen in the ED today for evaluation after a fall.  Stitches were placed in the right ear, these are absorbable so you do not need to have these removed.  A pressure dressing was also placed over the ear.  Please have this removed in 1 to 2 days.  If you notice significant swelling to the right ear, please be reevaluated.  A dressing was also placed on the right elbow, make sure to change this 1-2 times a week.  It is very important to be evaluated by a doctor in the next few days for your dizziness.  Your workup here today did not suggest an obvious source, but your renal function should be reassessed.  Please return the ED with any emergency medical symptoms.  Please return to the ED for any emergency medical symptoms.

## 2023-05-26 NOTE — ED Notes (Signed)
 Pt able to walk in the hallway with walker and 1 person assistance. RN notified.

## 2023-05-26 NOTE — ED Triage Notes (Signed)
 Pt BIB EMS from [facility] after falling out of chair when he felt dizzy. Head injury with lac to R eyebrow. Denies LOC. On xarelto.

## 2023-05-26 NOTE — ED Provider Notes (Signed)
 [facility] Provider Note   CSN: [id number] Arrival date & time: 05/26/23  1520     History  Chief Complaint  Patient presents with   Stanley Brooks is a 89 y.o. 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  Patient was playing cards at his facility when he felt lightheaded and fell over onto his right side.  He hit his head but did not lose consciousness, did not syncopized fully.  He denies any pain at this time, denied any prodromal symptoms including palpitations or chest pain prior to his lightheadedness.  Does not feel lightheaded now.  He did not ambulate since the incident.  Denies any headache, neck pain, chest pain, shortness of breath, abdominal pain, back pain, hip pain or pain to the legs.  He is still taking Xarelto .  He states that he felt lightheaded earlier today while in the shower after leaning forward towards his feet.   Fall       Home Medications Prior to Admission medications   Medication Sig Start Date End Date Taking? Authorizing Provider  acetaminophen  (TYLENOL ) 325 MG tablet Take 325-650 mg by mouth every 6 (six) hours as needed for mild pain (pain score 1-3) or headache.    [provider]  amLODipine  (NORVASC ) 5 MG tablet Take 1 tablet (5 mg total) by mouth daily. 09/14/22   Marguerite Shiley, MD  atorvastatin  (LIPITOR) 40 MG tablet TAKE 1 TABLET BY MOUTH EVERY DAY 03/28/23   Marguerite Shiley, MD  levothyroxine  (SYNTHROID ) 100 MCG tablet TAKE 1 TABLET BY MOUTH EVERY DAY BEFORE BREAKFAST 09/24/22   Marguerite Shiley, MD  Multiple Vitamins-Minerals (PRESERVISION AREDS PO) Take 1 capsule by mouth 2 (two) times a day.    [provider]  XARELTO  10 MG TABS tablet TAKE 1 TABLET BY MOUTH EVERY DAY 11/29/22   Marguerite Shiley, MD      Allergies    Neosporin  [neomycin-bacitracin zn-polymyx] and Penicillins    Review of Systems   Review of Systems  Physical Exam Updated Vital Signs BP (!) 165/95   Pulse 79   Temp 97.6 F (36.4 C) (Oral)   Resp 13   Ht 6\' 5"  (1.956 m)   Wt 81.6 kg   SpO2 100%   BMI 21.34 kg/m  Physical Exam Vitals and nursing note reviewed.  Constitutional:      General: He is not in acute distress.    Appearance: He is well-developed.  HENT:     Head: Normocephalic.     Comments: Abrasion above the right eyebrow, hemostatic.  Abrasion to the right pinna with mild oozing    Nose: Nose normal.     Mouth/Throat:     Mouth: Mucous membranes are moist.  Eyes:     Extraocular Movements: Extraocular movements intact.     Conjunctiva/sclera: Conjunctivae normal.     Pupils: Pupils are equal, round, and reactive to light.  Cardiovascular:     Rate and Rhythm: Normal rate and regular rhythm.     Heart sounds: No murmur heard. Pulmonary:     Effort: Pulmonary effort is normal. No respiratory distress.     Breath sounds: Normal breath sounds.  Chest:     Comments: Stable clavicles, stable chest wall to anterior and lateral compression with no pain or deformity Abdominal:  Palpations: Abdomen is soft.     Tenderness: There is no abdominal tenderness. There is no guarding or rebound.  Musculoskeletal:        General: No swelling.     Cervical back: Neck supple.     Right lower leg: No edema.     Left lower leg: No edema.     Comments: Pelvis stable to lateral compression, able to flex and extend bilateral legs at the hips, able to flex and extend knees. Full ROM of bilateral upper extremities.  Abrasion noted to the right elbow.  Skin:    General: Skin is warm and dry.     Capillary Refill: Capillary refill takes less than 2 seconds.  Neurological:     Mental Status: He is alert.  Psychiatric:        Mood and Affect: Mood normal.     ED Results / Procedures / Treatments   Labs (all labs ordered are  listed, but only abnormal results are displayed) Labs Reviewed  COMPREHENSIVE METABOLIC PANEL WITH GFR - Abnormal; Notable for the following components:      Result Value   CO2 15 (*)    Glucose, Bld 117 (*)    BUN 63 (*)    Creatinine, Ser 3.90 (*)    Calcium  8.5 (*)    GFR, Estimated 13 (*)    All other components within normal limits  CBC - Abnormal; Notable for the following components:   RBC 3.42 (*)    Hemoglobin 11.4 (*)    HCT 34.2 (*)    Platelets 149 (*)    All other components within normal limits  URINALYSIS, ROUTINE W REFLEX MICROSCOPIC - Abnormal; Notable for the following components:   Color, Urine STRAW (*)    Protein, ur 30 (*)    All other components within normal limits  I-STAT CHEM 8, ED - Abnormal; Notable for the following components:   Potassium 5.2 (*)    Chloride 115 (*)    BUN 76 (*)    Creatinine, Ser 4.30 (*)    Glucose, Bld 112 (*)    Calcium , Ion 0.92 (*)    TCO2 16 (*)    Hemoglobin 11.2 (*)    HCT 33.0 (*)    All other components within normal limits  ETHANOL  PROTIME-INR  I-STAT CG4 LACTIC ACID, ED  SAMPLE TO BLOOD BANK    EKG EKG Interpretation Date/Time:  Thursday May 26 2023 16:13:15 EDT Ventricular Rate:  65 PR Interval:  217 QRS Duration:  164 QT Interval:  443 QTC Calculation: 461 R Axis:   -71  Text Interpretation: Sinus rhythm Borderline prolonged PR interval RBBB and LAFB Lateral infarct, age indeterminate No old tracing to compare Confirmed by Hershel Los [phone]) on 05/26/2023 7:03:12 PM  Radiology DG Chest Portable 1 View Result Date: 05/26/2023 CLINICAL DATA:  glf, lightheaded. EXAM: PORTABLE CHEST 1 VIEW COMPARISON:  CT scan chest from 01/01/2023. FINDINGS: Redemonstration of thin walled cavitary 1.8 x 2.6 cm area overlying the left upper lung zone, similar to the prior CT scan. Bilateral lung fields are otherwise clear. No acute consolidation or lung collapse. Bilateral costophrenic angles are clear. Normal  cardio-mediastinal silhouette. No acute osseous abnormalities. The soft tissues are within normal limits. IMPRESSION: No acute cardiopulmonary abnormality. Redemonstration of thin walled cavitary area overlying the left upper lung zone, similar to the prior CT scan. Electronically Signed   By: Beula Brunswick M.D.   On: 05/26/2023 16:33   CT HEAD WO  CONTRAST Result Date: 05/26/2023 CLINICAL DATA:  Polytrauma, blunt; Head trauma, moderate-severe. Fall from chair associated with dizziness. Eyebrow laceration. EXAM: CT HEAD WITHOUT CONTRAST CT CERVICAL SPINE WITHOUT CONTRAST TECHNIQUE: Multidetector CT imaging of the head and cervical spine was performed following the standard protocol without intravenous contrast. Multiplanar CT image reconstructions of the cervical spine were also generated. RADIATION DOSE REDUCTION: This exam was performed according to the departmental dose-optimization program which includes automated exposure control, adjustment of the mA and/or kV according to patient size and/or use of iterative reconstruction technique. COMPARISON:  Chest CT 01/01/2023 FINDINGS: CT HEAD FINDINGS Brain: There is no evidence of an acute large territory infarct, intracranial hemorrhage, mass, midline shift, or extra-axial fluid collection. Age indeterminate lacunar infarcts are present in the left greater than right basal ganglia. Patchy bilateral cerebral white matter hypodensities are nonspecific but compatible with mild chronic small vessel ischemic disease. Generalized cerebral atrophy is mild for age. Vascular: Calcified atherosclerosis at the skull base. No hyperdense vessel. Skull: No acute fracture or suspicious lesion. Sinuses/Orbits: Mild mucosal thickening in the right maxillary sinus. Small right mastoid effusion. Bilateral cataract extraction. Other: None. CT CERVICAL SPINE FINDINGS Alignment: Trace retrolisthesis of C3 on C4 and trace anterolisthesis of C4 on C5, C7 on T1, and T1 on T2, likely  degenerative. Skull base and vertebrae: No acute fracture or suspicious lesion. Atlantodental degenerative changes. Soft tissues and spinal canal: No prevertebral fluid or swelling. No visible canal hematoma. Disc levels: Advanced disc and facet degeneration throughout the cervical and included upper thoracic spine. Moderate to severe neural foraminal stenosis bilaterally at C3-4 and on the left at C4-5 and C5-6. No evidence of high-grade spinal canal stenosis. Upper chest: Unchanged 2.7 cm cystic region in the apical left upper lobe and mild surrounding ground-glass opacity, nonspecific but may be post inflammatory as previously noted. No progressive solid component. Other: None. IMPRESSION: 1. No evidence of acute traumatic intracranial injury or cervical spine fracture. 2. Age indeterminate bilateral basal ganglia lacunar infarcts. Electronically Signed   By: Aundra Lee M.D.   On: 05/26/2023 16:21   CT CERVICAL SPINE WO CONTRAST Result Date: 05/26/2023 CLINICAL DATA:  Polytrauma, blunt; Head trauma, moderate-severe. Fall from chair associated with dizziness. Eyebrow laceration. EXAM: CT HEAD WITHOUT CONTRAST CT CERVICAL SPINE WITHOUT CONTRAST TECHNIQUE: Multidetector CT imaging of the head and cervical spine was performed following the standard protocol without intravenous contrast. Multiplanar CT image reconstructions of the cervical spine were also generated. RADIATION DOSE REDUCTION: This exam was performed according to the departmental dose-optimization program which includes automated exposure control, adjustment of the mA and/or kV according to patient size and/or use of iterative reconstruction technique. COMPARISON:  Chest CT 01/01/2023 FINDINGS: CT HEAD FINDINGS Brain: There is no evidence of an acute large territory infarct, intracranial hemorrhage, mass, midline shift, or extra-axial fluid collection. Age indeterminate lacunar infarcts are present in the left greater than right basal ganglia.  Patchy bilateral cerebral white matter hypodensities are nonspecific but compatible with mild chronic small vessel ischemic disease. Generalized cerebral atrophy is mild for age. Vascular: Calcified atherosclerosis at the skull base. No hyperdense vessel. Skull: No acute fracture or suspicious lesion. Sinuses/Orbits: Mild mucosal thickening in the right maxillary sinus. Small right mastoid effusion. Bilateral cataract extraction. Other: None. CT CERVICAL SPINE FINDINGS Alignment: Trace retrolisthesis of C3 on C4 and trace anterolisthesis of C4 on C5, C7 on T1, and T1 on T2, likely degenerative. Skull base and vertebrae: No acute fracture or suspicious lesion. Atlantodental degenerative  changes. Soft tissues and spinal canal: No prevertebral fluid or swelling. No visible canal hematoma. Disc levels: Advanced disc and facet degeneration throughout the cervical and included upper thoracic spine. Moderate to severe neural foraminal stenosis bilaterally at C3-4 and on the left at C4-5 and C5-6. No evidence of high-grade spinal canal stenosis. Upper chest: Unchanged 2.7 cm cystic region in the apical left upper lobe and mild surrounding ground-glass opacity, nonspecific but may be post inflammatory as previously noted. No progressive solid component. Other: None. IMPRESSION: 1. No evidence of acute traumatic intracranial injury or cervical spine fracture. 2. Age indeterminate bilateral basal ganglia lacunar infarcts. Electronically Signed   By: Aundra Lee M.D.   On: 05/26/2023 16:21    Procedures .Laceration Repair  Date/Time: 05/26/2023 7:56 PM  Performed by: Lorain Robson, MD Authorized by: Hershel Los, MD   Consent:    Consent obtained:  Verbal Universal protocol:    Patient identity confirmed:  Arm band Anesthesia:    Anesthesia method:  Nerve block   Block anesthetic:  Lidocaine  1% w/o epi   Block technique:  Right auricular nerve block   Block injection procedure:  Anatomic landmarks identified  and negative aspiration for blood   Block outcome:  Anesthesia achieved Laceration details:    Location:  Ear   Ear location:  R ear   Length (cm):  3 Treatment:    Area cleansed with:  Saline   Amount of cleaning:  Standard   Irrigation solution:  Sterile saline   Irrigation method:  Syringe Skin repair:    Repair method:  Sutures   Suture size:  5-0   Suture material:  Chromic gut   Number of sutures:  5 Approximation:    Approximation:  Close Repair type:    Repair type:  Simple Post-procedure details:    Dressing:  Non-adherent dressing   Procedure completion:  Tolerated well, no immediate complications     Medications Ordered in ED Medications  Tdap (BOOSTRIX) injection 0.5 mL (0.5 mLs Intramuscular Patient Refused/Not Given 05/26/23 1659)  lidocaine  (PF) (XYLOCAINE ) 1 % injection 30 mL (30 mLs Other Given by Other 05/26/23 1847)    ED Course/ Medical Decision Making/ A&P                                 Medical Decision Making Amount and/or Complexity of Data Reviewed Labs: ordered. Radiology: ordered.  Risk Prescription drug management.   Patient is alert, afebrile, and hemodynamically stable in no acute distress.  Physical exam as noted above with abrasions noted over the right eyebrow, right ear, and right elbow.  Will obtain CT head and C-spine, as well as labs, EKG, and chest x-ray due to patient's presyncope.  Differential includes electrolyte derangement, hypoglycemia, AKI, ICH, dysrhythmia, amongst other diagnoses.  Patient was offered tetanus vaccine, but states he had 1 about 2 years ago.  Labs resulted with no leukocytosis, hemoglobin 11.4, CMP with no severe electrolyte derangement, bicarb 15.  On chart review, bicarb remains below 20 over the last few months, likely related to RTA of renal insufficiency.  BUN 63 and creatinine 3.9, similar to 4 weeks ago.  Lactate WNL.  Coags normal.  Undetectable EtOH.  Urine noninfectious.  I personally interpreted  patient's EKG, which demonstrated sinus rhythm with rate of 65, right bundle branch and left anterior fascicular blocks noted with no acute ischemic changes.  Slightly prolonged PR interval of 217, possible  first-degree block but no other dysrhythmias noted.  I personally interpreted patient's chest x-ray, which demonstrated no pneumothorax, no obvious rib fractures or focal consolidations.  CT imaging resulted with no acute intracranial findings, no C-spine findings.  I performed laceration repair as noted above.  I placed pressure dressing on the right ear to ensure no hematoma formation, but spoke with family at bedside about prompt reevaluation if hematoma/cauliflower ear develops.  Right elbow wound was also wrapped.  Patient was able to ambulate in the ED with walker, which is his baseline.  We discussed following up with his primary care doctor for reevaluation given his episode of dizziness, though he does not have any dizziness at this time.  Him and family at bedside are agreeable to this plan.  Strict return precautions were given, and patient was discharged in stable condition.  Patient seen in conjunction with Dr. Nolia Baumgartner, who agreed with the above work-up and plan of care.          Final Clinical Impression(s) / ED Diagnoses Final diagnoses:  Fall, initial encounter    Rx / DC Orders ED Discharge Orders     None         Lorain Robson, MD 05/26/23 1610    Hershel Los, MD 06/06/23 1506

## 2023-07-21 DIAGNOSIS — H35371 Puckering of macula, right eye: Secondary | ICD-10-CM | POA: Diagnosis not present

## 2023-07-21 DIAGNOSIS — H353211 Exudative age-related macular degeneration, right eye, with active choroidal neovascularization: Secondary | ICD-10-CM | POA: Diagnosis not present

## 2023-07-21 DIAGNOSIS — H353134 Nonexudative age-related macular degeneration, bilateral, advanced atrophic with subfoveal involvement: Secondary | ICD-10-CM | POA: Diagnosis not present

## 2023-07-21 DIAGNOSIS — H43393 Other vitreous opacities, bilateral: Secondary | ICD-10-CM | POA: Diagnosis not present

## 2023-07-21 DIAGNOSIS — H43813 Vitreous degeneration, bilateral: Secondary | ICD-10-CM | POA: Diagnosis not present

## 2023-07-21 DIAGNOSIS — H33322 Round hole, left eye: Secondary | ICD-10-CM | POA: Diagnosis not present

## 2023-07-21 DIAGNOSIS — H35033 Hypertensive retinopathy, bilateral: Secondary | ICD-10-CM | POA: Diagnosis not present

## 2023-08-01 ENCOUNTER — Telehealth: Payer: Self-pay | Admitting: Pharmacist

## 2023-08-01 DIAGNOSIS — E7800 Pure hypercholesterolemia, unspecified: Secondary | ICD-10-CM

## 2023-08-01 NOTE — Progress Notes (Signed)
   08/01/2023  Patient ID: Stanley Brooks, male   DOB: Nov 12, 1925, 89 y.o.   MRN: [id number] Pharmacy Quality Measure Review  This patient is appearing on a report for being at risk of failing the adherence measure for cholesterol (statin) medications this calendar year.   Medication: Atorvastatin  40mg  Last fill date: 07/30/23 for 28 day supply  Reviewed Dr. Annemarie.  Patient is getting meds filled by [facility] because he is in a nursing home.  Atorvastatin  has been filled every month. There was a delay in filling from 03/03/23 to 04/21/23 but pretty consistently every month.  Most [facility] do monthly billing which may affect refill dates and ultimately adherence reporting.  Stanley Brooks, PharmD, BCACP Clinical Pharmacist [phone]

## 2023-08-29 ENCOUNTER — Encounter: Payer: Self-pay | Admitting: Pharmacist

## 2023-08-29 NOTE — Progress Notes (Signed)
   08/29/2023  Patient ID: Stanley Brooks RADDLE, male   DOB: [birth year], 89 y.o.   MRN: [id number]  Pharmacy Quality Measure Review  This patient is appearing on a report for being at risk of failing the adherence measure for cholesterol (statin) medications this calendar year.   Medication: Atorvastatin  40 mg Last fill date: 07/30/23 for 28 day supply  Insurance report was not up to date. No action needed at this time.  Patient lives in a skilled nursing facility and has his medications filled at [facility] which does monthly composite billing.  Cassius DOROTHA Brought, PharmD, BCACP Clinical Pharmacist [phone]

## 2023-10-03 DIAGNOSIS — H35371 Puckering of macula, right eye: Secondary | ICD-10-CM | POA: Diagnosis not present

## 2023-10-03 DIAGNOSIS — H43813 Vitreous degeneration, bilateral: Secondary | ICD-10-CM | POA: Diagnosis not present

## 2023-10-03 DIAGNOSIS — H43393 Other vitreous opacities, bilateral: Secondary | ICD-10-CM | POA: Diagnosis not present

## 2023-10-03 DIAGNOSIS — H33322 Round hole, left eye: Secondary | ICD-10-CM | POA: Diagnosis not present

## 2023-10-03 DIAGNOSIS — H35033 Hypertensive retinopathy, bilateral: Secondary | ICD-10-CM | POA: Diagnosis not present

## 2023-10-03 DIAGNOSIS — H353211 Exudative age-related macular degeneration, right eye, with active choroidal neovascularization: Secondary | ICD-10-CM | POA: Diagnosis not present

## 2023-10-03 DIAGNOSIS — H353134 Nonexudative age-related macular degeneration, bilateral, advanced atrophic with subfoveal involvement: Secondary | ICD-10-CM | POA: Diagnosis not present

## 2023-10-05 DIAGNOSIS — H6521 Chronic serous otitis media, right ear: Secondary | ICD-10-CM | POA: Diagnosis not present

## 2023-10-05 DIAGNOSIS — H6123 Impacted cerumen, bilateral: Secondary | ICD-10-CM | POA: Diagnosis not present

## 2023-10-05 DIAGNOSIS — H6990 Unspecified Eustachian tube disorder, unspecified ear: Secondary | ICD-10-CM | POA: Diagnosis not present

## 2023-10-11 DIAGNOSIS — N184 Chronic kidney disease, stage 4 (severe): Secondary | ICD-10-CM | POA: Diagnosis not present

## 2023-10-11 DIAGNOSIS — E785 Hyperlipidemia, unspecified: Secondary | ICD-10-CM | POA: Diagnosis not present

## 2023-10-11 DIAGNOSIS — I129 Hypertensive chronic kidney disease with stage 1 through stage 4 chronic kidney disease, or unspecified chronic kidney disease: Secondary | ICD-10-CM | POA: Diagnosis not present

## 2023-10-11 DIAGNOSIS — N2581 Secondary hyperparathyroidism of renal origin: Secondary | ICD-10-CM | POA: Diagnosis not present

## 2023-10-12 LAB — LAB REPORT - SCANNED: EGFR: 12

## 2023-12-07 DIAGNOSIS — R2681 Unsteadiness on feet: Secondary | ICD-10-CM | POA: Diagnosis not present

## 2023-12-07 DIAGNOSIS — R41841 Cognitive communication deficit: Secondary | ICD-10-CM | POA: Diagnosis not present

## 2023-12-07 DIAGNOSIS — Z9181 History of falling: Secondary | ICD-10-CM | POA: Diagnosis not present

## 2023-12-13 DIAGNOSIS — E785 Hyperlipidemia, unspecified: Secondary | ICD-10-CM | POA: Diagnosis not present

## 2023-12-13 DIAGNOSIS — R41841 Cognitive communication deficit: Secondary | ICD-10-CM | POA: Diagnosis not present

## 2023-12-13 DIAGNOSIS — Z9181 History of falling: Secondary | ICD-10-CM | POA: Diagnosis not present

## 2023-12-13 DIAGNOSIS — R2681 Unsteadiness on feet: Secondary | ICD-10-CM | POA: Diagnosis not present

## 2023-12-13 DIAGNOSIS — E039 Hypothyroidism, unspecified: Secondary | ICD-10-CM | POA: Diagnosis not present

## 2023-12-13 LAB — LIPID PANEL
Cholesterol: 164 (ref 0–200)
HDL: 50 (ref 35–70)
LDL Cholesterol: 89
Triglycerides: 125 (ref 40–160)

## 2023-12-13 LAB — TSH: TSH: 3.52 (ref 0.41–5.90)

## 2023-12-20 ENCOUNTER — Encounter: Payer: Self-pay | Admitting: Internal Medicine

## 2023-12-20 DIAGNOSIS — R2681 Unsteadiness on feet: Secondary | ICD-10-CM | POA: Diagnosis not present

## 2023-12-20 DIAGNOSIS — R41841 Cognitive communication deficit: Secondary | ICD-10-CM | POA: Diagnosis not present

## 2023-12-20 DIAGNOSIS — Z9181 History of falling: Secondary | ICD-10-CM | POA: Diagnosis not present

## 2023-12-21 DIAGNOSIS — M6281 Muscle weakness (generalized): Secondary | ICD-10-CM | POA: Diagnosis not present

## 2023-12-21 DIAGNOSIS — R2689 Other abnormalities of gait and mobility: Secondary | ICD-10-CM | POA: Diagnosis not present

## 2023-12-22 DIAGNOSIS — M6281 Muscle weakness (generalized): Secondary | ICD-10-CM | POA: Diagnosis not present

## 2023-12-22 DIAGNOSIS — R2689 Other abnormalities of gait and mobility: Secondary | ICD-10-CM | POA: Diagnosis not present

## 2023-12-27 DIAGNOSIS — M6281 Muscle weakness (generalized): Secondary | ICD-10-CM | POA: Diagnosis not present

## 2023-12-27 DIAGNOSIS — R2689 Other abnormalities of gait and mobility: Secondary | ICD-10-CM | POA: Diagnosis not present

## 2023-12-27 DIAGNOSIS — Z9181 History of falling: Secondary | ICD-10-CM | POA: Diagnosis not present

## 2023-12-27 DIAGNOSIS — R41841 Cognitive communication deficit: Secondary | ICD-10-CM | POA: Diagnosis not present

## 2023-12-27 DIAGNOSIS — R2681 Unsteadiness on feet: Secondary | ICD-10-CM | POA: Diagnosis not present

## 2023-12-29 DIAGNOSIS — M6281 Muscle weakness (generalized): Secondary | ICD-10-CM | POA: Diagnosis not present

## 2023-12-29 DIAGNOSIS — R2689 Other abnormalities of gait and mobility: Secondary | ICD-10-CM | POA: Diagnosis not present

## 2024-01-03 DIAGNOSIS — M6281 Muscle weakness (generalized): Secondary | ICD-10-CM | POA: Diagnosis not present

## 2024-01-03 DIAGNOSIS — R41841 Cognitive communication deficit: Secondary | ICD-10-CM | POA: Diagnosis not present

## 2024-01-03 DIAGNOSIS — R2681 Unsteadiness on feet: Secondary | ICD-10-CM | POA: Diagnosis not present

## 2024-01-03 DIAGNOSIS — Z9181 History of falling: Secondary | ICD-10-CM | POA: Diagnosis not present

## 2024-01-03 DIAGNOSIS — R2689 Other abnormalities of gait and mobility: Secondary | ICD-10-CM | POA: Diagnosis not present

## 2024-01-10 ENCOUNTER — Non-Acute Institutional Stay: Admitting: Internal Medicine

## 2024-01-10 DIAGNOSIS — R2689 Other abnormalities of gait and mobility: Secondary | ICD-10-CM | POA: Diagnosis not present

## 2024-01-10 DIAGNOSIS — M6281 Muscle weakness (generalized): Secondary | ICD-10-CM | POA: Diagnosis not present

## 2024-01-12 DIAGNOSIS — R2689 Other abnormalities of gait and mobility: Secondary | ICD-10-CM | POA: Diagnosis not present

## 2024-01-12 DIAGNOSIS — M6281 Muscle weakness (generalized): Secondary | ICD-10-CM | POA: Diagnosis not present

## 2024-01-15 NOTE — Progress Notes (Signed)
 A user error has taken place.

## 2024-01-31 ENCOUNTER — Non-Acute Institutional Stay: Admitting: Internal Medicine

## 2024-01-31 VITALS — BP 136/74 | HR 67 | Temp 97.4°F | Ht 77.0 in | Wt 154.0 lb

## 2024-01-31 DIAGNOSIS — I10 Essential (primary) hypertension: Secondary | ICD-10-CM | POA: Diagnosis not present

## 2024-01-31 DIAGNOSIS — R4189 Other symptoms and signs involving cognitive functions and awareness: Secondary | ICD-10-CM | POA: Diagnosis not present

## 2024-01-31 DIAGNOSIS — E7800 Pure hypercholesterolemia, unspecified: Secondary | ICD-10-CM

## 2024-01-31 DIAGNOSIS — E039 Hypothyroidism, unspecified: Secondary | ICD-10-CM | POA: Diagnosis not present

## 2024-01-31 DIAGNOSIS — N185 Chronic kidney disease, stage 5: Secondary | ICD-10-CM | POA: Diagnosis not present

## 2024-01-31 DIAGNOSIS — I825Y9 Chronic embolism and thrombosis of unspecified deep veins of unspecified proximal lower extremity: Secondary | ICD-10-CM | POA: Diagnosis not present

## 2024-01-31 NOTE — Progress Notes (Signed)
 "  Location:  Medical Illustrator of Service:  Clinic (12)  Provider:   Code Status:  Goals of Care:     05/26/2023    4:19 PM  Advanced Directives  Does Patient Have a Medical Advance Directive? Unable to assess, patient is non-responsive or altered mental status     Acute visit for Confusion falls weakness and weight loss  HPI: Patient is a 89 y.o. male seen today for medical management of chronic diseases.    Lives with his wife in [place] His Wife and Son came today Worsening cognition Recently patient has been more confused.  He has been walking with a walker and going into other  residents apartments looking for his wife.  The residents have been complaining to the nurses in [facility].  Patient also has lost weight.  Per his wife he sometimes also hallucinate.  CKD Stage 5  Last note of Dr Macel Patient is not a candidate for dialysis.  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  Does have some episodes of urinary and stool incontinence Past Medical History:  Diagnosis Date   Cataract 2008   left and right eyde By Dr. Rudy    DVT (deep venous thrombosis) (HCC)    Exudative age-related macular degeneration of right eye with active choroidal neovascularization (HCC)    Hypercholesterolemia    Hypertension    Hypothyroid    Kidney stone    Vitreous detachment, bilateral 03/15/2019    Past Surgical History:  Procedure Laterality Date   CATARACT EXTRACTION W/ INTRAOCULAR LENS IMPLANT Bilateral    COLONOSCOPY     Dr. Odis, prior polyps, but last due to age   HEMORROIDECTOMY  83   Dr. Rudean Daughters repair of right leg  Right    TOTAL HIP  ARTHROPLASTY      Allergies[1]  Outpatient Encounter Medications as of 01/31/2024  Medication Sig   acetaminophen  (TYLENOL ) 325 MG tablet Take 325-650 mg by mouth every 6 (six) hours as needed for mild pain (pain score 1-3) or headache.   amLODipine  (NORVASC ) 5 MG tablet Take 1 tablet (5 mg total) by mouth daily.   atorvastatin  (LIPITOR) 40 MG tablet TAKE 1 TABLET BY MOUTH EVERY DAY   levothyroxine  (SYNTHROID ) 100 MCG tablet TAKE 1 TABLET BY MOUTH EVERY DAY BEFORE BREAKFAST   Multiple Vitamins-Minerals (PRESERVISION AREDS PO) Take 1 capsule by mouth 2 (two) times a day.   XARELTO  10 MG TABS tablet TAKE 1 TABLET BY MOUTH EVERY DAY   No facility-administered encounter medications on file as of 01/31/2024.    Review of Systems:  Review of Systems  Constitutional:  Positive for unexpected weight change. Negative for activity change and appetite change.  HENT: Negative.    Respiratory:  Negative for cough and shortness of breath.   Cardiovascular:  Negative for leg swelling.  Gastrointestinal:  Negative for constipation.  Genitourinary:  Negative for frequency.  Musculoskeletal:  Positive for gait problem. Negative for arthralgias and myalgias.  Skin: Negative.  Negative for rash.  Neurological:  Negative for dizziness and weakness.  Psychiatric/Behavioral:  Positive for confusion. Negative for sleep disturbance.   All other  systems reviewed and are negative.   Health Maintenance  Topic Date Due   DTaP/Tdap/Td (1 - Tdap) Never done   Zoster Vaccines- Shingrix (1 of 2) Never done   Medicare Annual Wellness (AWV)  12/06/2020   Influenza Vaccine  09/09/2023   COVID-19 Vaccine (6 - 2025-26 season) 10/10/2023   Pneumococcal Vaccine: 50+ Years  Completed   Meningococcal B Vaccine  Aged Out    Physical Exam: Vitals:   01/31/24 0901  Temp: (!) 97.4 F (36.3 C)  Weight: 154 lb (69.9 kg)  Height: 6' 5 (1.956 m)   Body mass index is 18.26 kg/m. Physical Exam Vitals reviewed.   Constitutional:      Appearance: Normal appearance.  HENT:     Head: Normocephalic.     Nose: Nose normal.     Mouth/Throat:     Mouth: Mucous membranes are moist.     Pharynx: Oropharynx is clear.  Eyes:     Pupils: Pupils are equal, round, and reactive to light.  Cardiovascular:     Rate and Rhythm: Normal rate and regular rhythm.     Pulses: Normal pulses.     Heart sounds: No murmur heard. Pulmonary:     Effort: Pulmonary effort is normal. No respiratory distress.     Breath sounds: Normal breath sounds. No rales.  Abdominal:     General: Abdomen is flat. Bowel sounds are normal.     Palpations: Abdomen is soft.  Musculoskeletal:        General: No swelling.     Cervical back: Neck supple.  Skin:    General: Skin is warm.  Neurological:     General: No focal deficit present.     Mental Status: He is alert.     Comments: Patient did have Expressive aphasia was mumbling sometime Was able to name some objects for me Very unstable when stands up but was ok with his walker Had bruises on his Hands and arms Did know his age Thought Month was dominic but knew Christmas    Psychiatric:        Mood and Affect: Mood normal.        Thought Content: Thought content normal.     Labs reviewed: Basic Metabolic Panel: Recent Labs    04/26/23 0000 04/28/23 0000 04/28/23 0700 05/26/23 1534 05/26/23 1536 12/13/23 0000  NA 133* 138  --  136 135  --   K 4.5 4.8  --  4.8 5.2*  --   CL 104 108  --  110 115*  --   CO2 17 16  --  15*  --   --   GLUCOSE  --   --   --  117* 112*  --   BUN 57* 56*  --  63* 76*  --   CREATININE 3.7*  --   --  3.90* 4.30*  --   CALCIUM  8.6*  --  8.7 8.5*  --   --   TSH 2.14  --   --   --   --  3.52   Liver Function Tests: Recent Labs    04/26/23 0000 04/28/23 0000 04/28/23 0700 05/26/23 1534  AST 19 21  --  20  ALT 22 21  --  21  ALKPHOS 167* 159*  --  97  BILITOT  --   --   --  0.3  PROT  --   --   --  6.6  ALBUMIN 4.1  --  4.2 3.9  No  results for input(s): LIPASE, AMYLASE in the last 8760 hours. No results for input(s): AMMONIA in the last 8760 hours. CBC: Recent Labs    04/26/23 0000 04/28/23 0000 05/26/23 1534 05/26/23 1536  WBC 4.1  4.1 5.0 4.7  --   HGB 11.3*  11.3* 11.1* 11.4* 11.2*  HCT 33*  33* 33* 34.2* 33.0*  MCV  --   --  100.0  --   PLT 179 197 149*  --    Lipid Panel: Recent Labs    04/26/23 0000 04/28/23 0000 12/13/23 0730  CHOL 148 159 164  HDL 52 47 50  LDLCALC 74 82 89  TRIG 107 151 125   No results found for: HGBA1C  Procedures since last visit: No results found.  Assessment/Plan 1. Cognitive impairment (Primary) I discussed with the son and the wife. Patient's had a CT done in 4/25 which showed Bilateral Basal Ganglia Lacunar infarcts  chronic small vessel disease  And Atrophy  Patient is worsening cognition can be due to combination of cerebrovascular disease and worsening renal function. We discussed that patient needs to come to rehab as he is not safe in [place].  Wife is having some issues and is not able to take care of him.  They do not want to do that is over the Christmas.  They are planning to admit him to skilled level of care next week   2. Stage 5 chronic kidney disease not on chronic dialysis [facility]) Discussed again with the family patient has stage V CKD.  He is not on a dialysis candidate and would be better served with comfort care/hospice and by being Skilled care  3. Essential hypertension On Norvasc   4. Acquired hypothyroidism Need follow up of TSH  5. Pure hypercholesterolemia Would discontinue Statin once he comes to rehab  6. Chronic deep vein thrombosis (DVT) of proximal vein of lower extremity, unspecified laterality (HCC) Possible discuss about stopping Xarelto       Labs/tests ordered:  * No order type specified * Next appt:  Visit date not found        [1]  Allergies Allergen Reactions   Neosporin [Neomycin-Bacitracin Zn-Polymyx]  Dermatitis   Penicillins Other (See Comments)    Reaction not recalled, but the reaction was bad   "

## 2024-02-06 ENCOUNTER — Non-Acute Institutional Stay (SKILLED_NURSING_FACILITY): Payer: Self-pay | Admitting: Internal Medicine

## 2024-02-06 ENCOUNTER — Encounter: Payer: Self-pay | Admitting: Internal Medicine

## 2024-02-06 DIAGNOSIS — R627 Adult failure to thrive: Secondary | ICD-10-CM | POA: Diagnosis not present

## 2024-02-06 DIAGNOSIS — I700 Atherosclerosis of aorta: Secondary | ICD-10-CM

## 2024-02-06 DIAGNOSIS — I10 Essential (primary) hypertension: Secondary | ICD-10-CM | POA: Diagnosis not present

## 2024-02-06 DIAGNOSIS — F01C18 Vascular dementia, severe, with other behavioral disturbance: Secondary | ICD-10-CM

## 2024-02-06 DIAGNOSIS — E7800 Pure hypercholesterolemia, unspecified: Secondary | ICD-10-CM | POA: Diagnosis not present

## 2024-02-06 DIAGNOSIS — F0150 Vascular dementia without behavioral disturbance: Secondary | ICD-10-CM | POA: Insufficient documentation

## 2024-02-06 DIAGNOSIS — E039 Hypothyroidism, unspecified: Secondary | ICD-10-CM

## 2024-02-06 NOTE — Assessment & Plan Note (Signed)
 12/13/2023 TSH was therapeutic at 3.52; no change in L-thyroxine dose indicated.

## 2024-02-06 NOTE — Assessment & Plan Note (Signed)
 Staff states that he has been intermittently confused and walking into other residents' apartments. This is in context of recurrent falls has warranted placement in rehab for PT/OT.  Speech therapy will perform MMSE in a few days.

## 2024-02-06 NOTE — Progress Notes (Unsigned)
 "   NURSING HOME LOCATION: [facility] ROOM NUMBER:  [id number]  CODE STATUS:  DNR  PCP: Charlanne Fredia CROME, MD   This is a nursing facility follow up visit for admission to rehab for adult failure to thrive.  Interim medical record and care since last SNF visit was updated with review of diagnostic studies and change in clinical status since last visit were documented.  HPI: He has been in independent living with his wife but she had been moved to rehab.  He has had increasing confusion, walking into other residents' apartments.  He has fallen recurrently.  He has also had some urinary incontinence.  He has been requiring increasing help with ADLs.  Past medical history includes hypothyroidism; essential hypertension; dyslipidemia; history of DVT; macular degeneration; and history of nephrolithiasis. He was seen in the ED 05/26/2023 with polytrauma after a fall.  He sustained a small laceration to the right ear which was repaired with sutures and pressure dressing.  CT of the head revealed no acute large territory infarct, intracranial hemorrhage, mass, or midline shift.  Age-indeterminate lacunar infarcts were present, greater in the left than the right basal ganglia.  There was patchy bilateral cerebral white matter hypodensities compatible with mild chronic small vessel ischemic disease.  Generalized cerebral atrophy was mild for age.  Calcified atherosclerosis at the skull base was noted. The most current labs on record were performed 12/13/2023 and revealed an LDL of 89.  TSH was therapeutic at 3.52 .  Prior labs were performed 05/26/2023 and revealed anemia with H/H of 11.2/33 and mild hyperglycemia with a glucose of 112.  On that date total protein was 6.6 and albumin 3.9.  Surgeries and procedures include colonoscopy; plateau repair of right leg; and total hip arthroplasty.  He is a nondrinker; he is a former smoker.  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  Review of systems:  Dementia invalidated responses.  He states I am doing all right.  He did seem to validate some abdominal pain pointing the right upper quadrant and right inferior thoracic area.  Twice he made the confabulating statement I just put blankets on my bed so I could go back to sleep.  He told me that he had retired 3-4 months ago as an pensions consultant.  Constitutional: No fever, significant weight change, fatigue  Eyes: No redness, discharge, pain, vision change ENT/mouth: No nasal congestion,  purulent discharge, earache, change in hearing, sore throat  Cardiovascular: No chest pain, palpitations, paroxysmal nocturnal dyspnea, claudication, edema  Respiratory: No cough, sputum production, hemoptysis, DOE, significant snoring, apnea   Gastrointestinal: No heartburn, dysphagia, nausea /vomiting, rectal bleeding, melena, change in bowels Genitourinary: No dysuria, hematuria, pyuria, incontinence, nocturia Musculoskeletal: No joint stiffness, joint swelling, weakness, pain Dermatologic: No rash, pruritus, change in appearance of skin Neurologic: No dizziness, headache, syncope, seizures, numbness, tingling Psychiatric: No significant anxiety, depression, insomnia, anorexia Endocrine: No change in hair/skin/nails, excessive thirst, excessive hunger, excessive urination  Hematologic/lymphatic: No significant bruising, lymphadenopathy, abnormal bleeding Allergy/immunology: No itchy/watery eyes, significant sneezing, urticaria, angioedema  Physical exam:  Pertinent or positive findings: He appears his age and chronically ill.  Pattern alopecia is present.  Hair is disheveled.  He is profoundly deaf.  Facies are gaunt.  Slight exotropia of the left eye intermittently present.  Heart sounds are distant, heard best in the epigastrium.  He has low-grade slightly musical expiratory rhonchi.  There is no abdominal tenderness despite his complaint of right upper quadrant pain.  Pedal  pulses are not palpable.  Limb  atrophy and interosseous wasting noted.  There is faint bluish discoloration of the right thumb  nail and right ventral index finger which almost appears cyanotic; but the other nails are essentially normal.  There is faint ecchymosis at the base of the right thumb.  Strength to opposition is fair in the lower extremities and only mildly reduced in the upper extremities.  General appearance:  no acute distress, increased work of breathing is present.   Lymphatic: No lymphadenopathy about the head, neck, axilla. Eyes: No conjunctival inflammation or lid edema is present. There is no scleral icterus. Ears:  External ear exam shows no significant lesions or deformities.   Nose:  External nasal examination shows no deformity or inflammation. Nasal mucosa are pink and moist without lesions, exudates Oral exam:  Lips and gums are healthy appearing. There is no oropharyngeal erythema or exudate. Neck:  No thyromegaly, masses, tenderness noted.    Heart:  No gallop, murmur, click, rub .  Lungs:  without wheezes, rales, rubs. Abdomen: Bowel sounds are normal. Abdomen is soft and nontender with no organomegaly, hernias, masses. GU: Deferred  Extremities:  No cyanosis, clubbing, edema  Neurologic exam :Balance, Rhomberg, finger to nose testing could not be completed due to clinical state Deep tendon reflexes are equal Skin: Warm & dry w/o tenting. No significant lesions or rash.  See summary under each active problem in the Problem List with associated updated therapeutic plan :  Adult failure to thrive syndrome Adult failure to thrive syndrome is multifactorial including advanced age, atherosclerosis, probable protein/caloric malnutrition based on exam findings of limb atrophy & IOW, and vascular dementia.  Acquired hypothyroidism 12/13/2023 TSH was therapeutic at 3.52; no change in L-thyroxine dose indicated.  Aortic atherosclerosis Atherosclerosis at the base of the skull documented on CT of the  head 05/26/2023.  He denies any active anginal equivalent.  Essential hypertension Blood pressure is elevated but he has been somewhat agitated.  Blood pressure average will be verified and antihypertensive medicines adjusted appropriately.  Pure hypercholesterolemia 12/13/2023 LDL is 89 on present dose of statin.  Consideration could be given to changing to to rosuvastatin to get LDL less than 70.  Vascular dementia [facility]) Staff states that he has been intermittently confused and walking into other residents' apartments. This is in context of recurrent falls has warranted placement in rehab for PT/OT.  Speech therapy will perform MMSE in a few days.   "

## 2024-02-06 NOTE — Assessment & Plan Note (Signed)
 Adult failure to thrive syndrome is multifactorial including advanced age, atherosclerosis, probable protein/caloric malnutrition, and vascular dementia.

## 2024-02-06 NOTE — Assessment & Plan Note (Signed)
 Atherosclerosis at the base of the skull documented on CT of the head 05/26/2023.  He denies any active anginal equivalent.

## 2024-02-06 NOTE — Assessment & Plan Note (Signed)
 12/13/2023 LDL is 89 on present dose of statin.  Consideration could be given to changing to to rosuvastatin to get LDL less than 70.

## 2024-02-06 NOTE — Patient Instructions (Signed)
 See assessment and plan under each diagnosis in the problem list and acutely for this visit

## 2024-02-06 NOTE — Assessment & Plan Note (Signed)
 Blood pressure is elevated but he has been somewhat agitated.  Blood pressure average will be verified and antihypertensive medicines adjusted appropriately.

## 2024-02-20 ENCOUNTER — Non-Acute Institutional Stay: Payer: Self-pay | Admitting: Internal Medicine

## 2024-02-20 ENCOUNTER — Encounter: Payer: Self-pay | Admitting: Internal Medicine

## 2024-02-20 NOTE — Progress Notes (Signed)
 " [facility] Room Number: Rehab 154-P Place of Service:  SNF [phone]) Provider: Charlanne Fredia CROME, MD  Patient Care Team: Charlanne Fredia CROME, MD as PCP - General (Internal Medicine) Raj Rankin SAUNDERS, MD as Referring Physician (Ophthalmology) Carlie Clark, MD as Consulting Physician (Otolaryngology)  Extended Emergency Contact Information Primary Emergency Contact: [facility] Phone: [phone] Relation: Spouse  Code Status:  DNR Goals of care: Advanced Directive information    05/26/2023    4:19 PM  Advanced Directives  Does Patient Have a Medical Advance Directive? Unable to assess, patient is non-responsive or altered mental status     Chief Complaint  Patient presents with   medication management of chronic issues    Acute visit    HPI:  Pt is a 89 y.o. male seen today for an acute visit for    Past Medical History:  Diagnosis Date   Cataract 2008   left and right eyde By Dr. 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 Odis, prior polyps, but last due to age   HEMORROIDECTOMY  47   Dr. Rudean Daughters repair of right leg  Right    TOTAL HIP ARTHROPLASTY      Allergies[1]  Outpatient Encounter Medications as of 02/20/2024  Medication Sig   acetaminophen  (TYLENOL ) 325 MG tablet Take 325-650 mg by mouth every 6 (six) hours as needed for mild pain (pain score 1-3) or headache. (Patient taking differently: Take 325 mg by mouth every 6 (six) hours as needed for mild pain (pain score 1-3) or headache.)   amLODipine  (NORVASC ) 5 MG tablet Take 1 tablet (5 mg  total) by mouth daily.   atorvastatin  (LIPITOR) 40 MG tablet TAKE 1 TABLET BY MOUTH EVERY DAY   dextromethorphan-guaiFENesin (ROBITUSSIN-DM) 10-100 MG/5ML liquid Take by mouth every 6 (six) hours as needed for cough (Covid).   guaiFENesin (MUCINEX) 600 MG 12 hr tablet Take 600 mg by mouth 2 (two) times daily.   levothyroxine  (SYNTHROID ) 100 MCG tablet TAKE 1 TABLET BY MOUTH EVERY DAY BEFORE BREAKFAST   loperamide (IMODIUM A-D) 2 MG tablet Take 2 mg by mouth as needed for diarrhea or loose stools (Every 24 hours as needed 10 days for covid).   Multiple Vitamins-Minerals (PRESERVISION AREDS PO) Take 1 capsule by mouth 2 (two) times a day.   Nirmatrelvir-Ritonavir (PAXLOVID, 150/100, PO) Take 2 tablets by mouth 2 (two) times daily. Covid take until 02/23/2024   XARELTO  10 MG TABS tablet TAKE 1 TABLET BY MOUTH EVERY DAY   No facility-administered encounter medications on file as of 02/20/2024.    Review of Systems  Immunization History  Administered Date(s) Administered   Fluad Quad(high Dose 65+) 12/18/2019   INFLUENZA, HIGH DOSE SEASONAL PF 11/28/2014, 12/12/2017, 10/08/2018, 12/30/2022   Influenza-Unspecified 12/08/2016, 12/30/2020   Moderna SARS-COV2 Booster Vaccination 12/19/2019, 11/21/2020   Moderna Sars-Covid-2 Vaccination 02/20/2019, 03/20/2019, 12/25/2019   PNEUMOCOCCAL CONJUGATE-20 01/14/2023   Pneumococcal Conjugate-13 01/19/2017   Pneumococcal Polysaccharide-23 06/14/2018   Unspecified SARS-COV-2 Vaccination 12/30/2022   Pertinent  Health Maintenance Due  Topic Date Due   Influenza  Vaccine  09/09/2023      06/30/2021    4:38 PM 12/01/2021   10:07 AM 05/25/2022   10:35 AM 07/13/2022    3:36 PM 09/14/2022   10:33 AM  Fall Risk  Falls in the past year? 0 0 0 0 0  Was there an injury with Fall? 0  0  0  0  0   Fall Risk Category Calculator 0 0 0 0 0  Fall Risk Category (Retired) Low  Low      (RETIRED) Patient Fall Risk Level Low fall risk  Low fall risk      Patient at Risk  for Falls Due to No Fall Risks Impaired balance/gait;Impaired mobility Impaired balance/gait;Impaired mobility;History of fall(s) No Fall Risks No Fall Risks  Fall risk Follow up Falls evaluation completed  Falls evaluation completed  Falls evaluation completed Falls evaluation completed Falls evaluation completed     Data saved with a previous flowsheet row definition   Functional Status Survey:    Vitals:   02/20/24 1435  BP: 132/72  Pulse: 63  SpO2: 95%  Weight: 146 lb 12.8 oz (66.6 kg)  Height: 6' 5 (1.956 m)   Body mass index is 17.41 kg/m. Physical Exam  Labs reviewed: Recent Labs    04/26/23 0000 04/28/23 0000 04/28/23 0700 05/26/23 1534 05/26/23 1536  NA 133* 138  --  136 135  K 4.5 4.8  --  4.8 5.2*  CL 104 108  --  110 115*  CO2 17 16  --  15*  --   GLUCOSE  --   --   --  117* 112*  BUN 57* 56*  --  63* 76*  CREATININE 3.7*  --   --  3.90* 4.30*  CALCIUM  8.6*  --  8.7 8.5*  --    Recent Labs    04/26/23 0000 04/28/23 0000 04/28/23 0700 05/26/23 1534  AST 19 21  --  20  ALT 22 21  --  21  ALKPHOS 167* 159*  --  97  BILITOT  --   --   --  0.3  PROT  --   --   --  6.6  ALBUMIN 4.1  --  4.2 3.9   Recent Labs    04/26/23 0000 04/28/23 0000 05/26/23 1534 05/26/23 1536  WBC 4.1  4.1 5.0 4.7  --   HGB 11.3*  11.3* 11.1* 11.4* 11.2*  HCT 33*  33* 33* 34.2* 33.0*  MCV  --   --  100.0  --   PLT 179 197 149*  --    Lab Results  Component Value Date   TSH 3.52 12/13/2023   No results found for: HGBA1C Lab Results  Component Value Date   CHOL 164 12/13/2023   HDL 50 12/13/2023   LDLCALC 89 12/13/2023   TRIG 125 12/13/2023    Significant Diagnostic Results in last 30 days:  No results found.  Assessment/Plan There are no diagnoses linked to this encounter.   Family/ staff Communication: ***  Labs/tests ordered:  ***     [1]  Allergies Allergen Reactions   Neosporin [Neomycin-Bacitracin Zn-Polymyx] Dermatitis   Penicillins  Other (See Comments)    Reaction not recalled, but the reaction was bad   "

## 2024-03-02 ENCOUNTER — Non-Acute Institutional Stay (SKILLED_NURSING_FACILITY): Payer: Self-pay | Admitting: Adult Health

## 2024-03-02 DIAGNOSIS — N185 Chronic kidney disease, stage 5: Secondary | ICD-10-CM | POA: Diagnosis not present

## 2024-03-02 DIAGNOSIS — D631 Anemia in chronic kidney disease: Secondary | ICD-10-CM | POA: Diagnosis not present

## 2024-03-03 ENCOUNTER — Encounter: Payer: Self-pay | Admitting: Adult Health

## 2024-03-03 NOTE — Progress Notes (Signed)
 " Location:  Medical Illustrator of Service:  SNF (31) Provider:   Bari America, ANP [facility] [phone]   Charlanne Fredia CROME, MD  Patient Care Team: Charlanne Fredia CROME, MD as PCP - General (Internal Medicine) Raj Rankin SAUNDERS, MD as Referring Physician (Ophthalmology) Carlie Clark, MD as Consulting Physician (Otolaryngology)  Extended Emergency Contact Information Primary Emergency Contact: [facility] Phone: [phone] Relation: Spouse  Code Status:  DNR Goals of care: Advanced Directive information    05/26/2023    4:19 PM  Advanced Directives  Does Patient Have a Medical Advance Directive? Unable to assess, patient is non-responsive or altered mental status     Chief Complaint  Patient presents with   Acute Visit    F/u anemia    HPI:  Pt is a 89 y.o. male seen today for an acute visit for anemia  He had a CBC and his Hgb returned at 7.9 on 02/27/24.  MCV 95.6 plt 197 WBC 6.2.  Hgb 11.4 05/26/23  CKD V followed by renal with GFR of 12 Cr 4.36 Sept of 2025, possible hospice in the near future as Dr Macel states he is not a dialysis candidate. Labs are drawn at the renal office.  Has FTT and dementia.  Recently moved to the memory care unit.   On xarelto  previously for remote DVT hx, now xarelto  is on hold due to his dropping Hgb.   Hemoccult x 2 ordered, first one is negative. He states he feels fine, no issues expressed by nurse.    Past Medical History:  Diagnosis Date   Cataract 2008   left and right eyde By Dr. 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  Odis, prior polyps, but last due to age   HEMORROIDECTOMY  52   Dr. Rudean Daughters repair of right leg  Right    TOTAL HIP ARTHROPLASTY      Allergies[1]  Outpatient Encounter Medications as of 03/02/2024  Medication Sig   acetaminophen  (TYLENOL ) 325 MG tablet Take 325 mg by mouth every 6 (six) hours as needed for mild pain (pain score 1-3) or headache.   amLODipine  (NORVASC ) 5 MG tablet Take 1 tablet (5 mg total) by mouth daily.   atorvastatin  (LIPITOR) 40 MG tablet TAKE 1 TABLET BY MOUTH EVERY DAY   dextromethorphan-guaiFENesin (ROBITUSSIN-DM) 10-100 MG/5ML liquid Take by mouth every 6 (six) hours as needed for cough (Covid).   levothyroxine  (SYNTHROID ) 100 MCG tablet TAKE 1 TABLET BY MOUTH EVERY DAY BEFORE BREAKFAST   loperamide (IMODIUM A-D) 2 MG tablet Take 2 mg by mouth as needed for diarrhea or loose stools (Every 24 hours as needed 10 days for covid).   Multiple Vitamins-Minerals (PRESERVISION AREDS PO) Take 1 capsule by mouth 2 (two) times a day.   [DISCONTINUED] guaiFENesin (MUCINEX) 600 MG 12 hr tablet Take 600 mg by mouth 2 (two) times daily.   [DISCONTINUED] Nirmatrelvir-Ritonavir (PAXLOVID, 150/100, PO) Take 2 tablets by mouth 2 (two) times daily. Covid take until 02/23/2024   [DISCONTINUED] XARELTO  10 MG TABS tablet TAKE 1 TABLET BY  MOUTH EVERY DAY   No facility-administered encounter medications on file as of 03/02/2024.    Review of Systems  Constitutional:  Positive for fatigue. Negative for activity change, appetite change, chills, diaphoresis, fever and unexpected weight change.  Respiratory:  Negative for cough, shortness of breath, wheezing and stridor.   Cardiovascular:  Negative for chest pain, palpitations and leg swelling.  Gastrointestinal:  Negative for abdominal distention, abdominal pain, constipation and diarrhea.  Genitourinary:  Negative for difficulty urinating and dysuria.  Musculoskeletal:  Positive for gait problem. Negative for arthralgias,  back pain, joint swelling and myalgias.  Neurological:  Negative for dizziness, seizures, syncope, facial asymmetry, speech difficulty, weakness and headaches.  Hematological:  Negative for adenopathy. Does not bruise/bleed easily.  Psychiatric/Behavioral:  Positive for confusion. Negative for agitation and behavioral problems.     Immunization History  Administered Date(s) Administered   Fluad Quad(high Dose 65+) 12/18/2019   INFLUENZA, HIGH DOSE SEASONAL PF 11/28/2014, 12/12/2017, 10/08/2018, 12/30/2022   Influenza-Unspecified 12/08/2016, 12/30/2020   Moderna SARS-COV2 Booster Vaccination 12/19/2019, 11/21/2020   Moderna Sars-Covid-2 Vaccination 02/20/2019, 03/20/2019, 12/25/2019   PNEUMOCOCCAL CONJUGATE-20 01/14/2023   Pneumococcal Conjugate-13 01/19/2017   Pneumococcal Polysaccharide-23 06/14/2018   Unspecified SARS-COV-2 Vaccination 12/30/2022   Pertinent  Health Maintenance Due  Topic Date Due   Influenza Vaccine  09/09/2023      06/30/2021    4:38 PM 12/01/2021   10:07 AM 05/25/2022   10:35 AM 07/13/2022    3:36 PM 09/14/2022   10:33 AM  Fall Risk  Falls in the past year? 0 0 0 0 0  Was there an injury with Fall? 0  0  0  0  0   Fall Risk Category Calculator 0 0 0 0 0  Fall Risk Category (Retired) Low  Low      (RETIRED) Patient Fall Risk Level Low fall risk  Low fall risk      Patient at Risk for Falls Due to No Fall Risks Impaired balance/gait;Impaired mobility Impaired balance/gait;Impaired mobility;History of fall(s) No Fall Risks No Fall Risks  Fall risk Follow up Falls evaluation completed  Falls evaluation completed  Falls evaluation completed Falls evaluation completed Falls evaluation completed     Data saved with a previous flowsheet row definition   Functional Status Survey:    Vitals:   03/03/24 0803  BP: (!) 130/54  Pulse: 70  Resp: 18  Temp: (!) 97.2 F (36.2 C)  SpO2: 100%  Weight: 140 lb 9.6 oz (63.8 kg)   Body mass index is 16.67 kg/m. Physical  Exam Vitals and nursing note reviewed.  Constitutional:      Appearance: Normal appearance.  HENT:     Head: Normocephalic and atraumatic.  Cardiovascular:     Rate and Rhythm: Normal rate and regular rhythm.     Heart sounds: No murmur heard. Pulmonary:     Effort: Pulmonary effort is normal. No respiratory distress.     Breath sounds: Normal breath sounds. No wheezing.  Abdominal:     General: Bowel sounds are normal. There is no distension.     Palpations: Abdomen is soft.     Tenderness: There is no abdominal tenderness.  Musculoskeletal:     Cervical back: Normal range of motion. No rigidity.  Lymphadenopathy:     Cervical: No cervical adenopathy.  Skin:    General: Skin is warm and dry.     Coloration: Skin is pale.  Neurological:     Mental Status: He is alert. Mental status is  at baseline.  Psychiatric:        Mood and Affect: Mood normal.     Labs reviewed: Recent Labs    04/26/23 0000 04/28/23 0000 04/28/23 0700 05/26/23 1534 05/26/23 1536  NA 133* 138  --  136 135  K 4.5 4.8  --  4.8 5.2*  CL 104 108  --  110 115*  CO2 17 16  --  15*  --   GLUCOSE  --   --   --  117* 112*  BUN 57* 56*  --  63* 76*  CREATININE 3.7*  --   --  3.90* 4.30*  CALCIUM  8.6*  --  8.7 8.5*  --    Recent Labs    04/26/23 0000 04/28/23 0000 04/28/23 0700 05/26/23 1534  AST 19 21  --  20  ALT 22 21  --  21  ALKPHOS 167* 159*  --  97  BILITOT  --   --   --  0.3  PROT  --   --   --  6.6  ALBUMIN 4.1  --  4.2 3.9   Recent Labs    04/26/23 0000 04/28/23 0000 05/26/23 1534 05/26/23 1536  WBC 4.1  4.1 5.0 4.7  --   HGB 11.3*  11.3* 11.1* 11.4* 11.2*  HCT 33*  33* 33* 34.2* 33.0*  MCV  --   --  100.0  --   PLT 179 197 149*  --    Lab Results  Component Value Date   TSH 3.52 12/13/2023   No results found for: HGBA1C Lab Results  Component Value Date   CHOL 164 12/13/2023   HDL 50 12/13/2023   LDLCALC 89 12/13/2023   TRIG 125 12/13/2023    Significant  Diagnostic Results in last 30 days:  No results found.  Assessment/Plan  1. Anemia of chronic renal failure, stage 5 (HCC) (Primary) Mostly likely his anemia is due to poor renal function and failure to thrive with dementia. Xarelto  is on hold 1 hemoccult negative, need 1 more Repeat CBC with iron panel in 1 week Dr charlanne to f/u consider hospice     Family/ staff Communication: discussed with Dr Charlanne.   Labs/tests ordered:  CBC Iron panel     [1]  Allergies Allergen Reactions   Neosporin [Neomycin-Bacitracin Zn-Polymyx] Dermatitis   Penicillins Other (See Comments)    Reaction not recalled, but the reaction was bad   "

## 2024-03-16 ENCOUNTER — Encounter: Payer: Self-pay | Admitting: Pharmacist

## 2024-03-16 ENCOUNTER — Encounter: Payer: Self-pay | Admitting: Adult Health

## 2024-03-16 ENCOUNTER — Non-Acute Institutional Stay: Payer: Self-pay | Admitting: Adult Health

## 2024-03-16 DIAGNOSIS — Z0000 Encounter for general adult medical examination without abnormal findings: Secondary | ICD-10-CM

## 2024-03-16 NOTE — Progress Notes (Addendum)
 "  Chief Complaint  Patient presents with   Medicare Wellness     Subjective:   Stanley Brooks is a 89 y.o. male who presents for a Medicare Annual Wellness Visit at [facility].   Visit info / Clinical Intake: Medicare Wellness Visit Type:: Subsequent Annual Wellness Visit Persons participating in visit and providing information:: patient & caregiver Medicare Wellness Visit Mode:: In-person (required for WTM) Interpreter Needed?: No Pre-visit prep was completed: no AWV questionnaire completed by patient prior to visit?: no Living arrangements:: in nursing facility Patient's Overall Health Status Rating: (!) poor Typical amount of pain: none Does pain affect daily life?: no Are you currently prescribed opioids?: no  Dietary Habits and Nutritional Risks How many meals a day?: 3 Eats fruit and vegetables daily?: yes Most meals are obtained by: having others provide food In the last 2 weeks, have you had any of the following?: unintentional weight loss Diabetic:: no  Functional Status Activities of Daily Living (to include ambulation/medication): (!) Dependent Feeding: Independent with device- listed below Dressing/Grooming: Dependent Bathing: Dependent Toileting: Independent with device- listed below Transfer: Independent with device- listed below Ambulation: Independent with device- listed below Home Assistive Devices/Equipment: Walker (specify Type) Medication Administration: Dependent Is this a change from baseline?: Pre-admission baseline Home Management (perform basic housework or laundry): Dependent Manage your own finances?: (!) no Primary transportation is: facility / other Concerns about vision?: no *vision screening is required for WTM* Concerns about hearing?: (!) yes Uses hearing aids?: (!) yes Hear whispered voice?: (!) no *in-person visit only*  Fall Screening Falls in the past year?: 1 Number of falls in past year:  1 Was there an injury with Fall?: 1 Fall Risk Category Calculator: 3 Patient Fall Risk Level: High Fall Risk  Fall Risk Patient at Risk for Falls Due to: History of fall(s) Fall risk Follow up: Falls evaluation completed  Home and Transportation Safety: All rugs have non-skid backing?: N/A, no rugs All stairs or steps have railings?: N/A, no stairs Grab bars in the bathtub or shower?: yes Have non-skid surface in bathtub or shower?: yes Good home lighting?: yes Regular seat belt use?: yes Hospital stays in the last year:: no  Cognitive Assessment Difficulty concentrating, remembering, or making decisions? : yes Will 6CIT or Mini Cog be Completed: no 6CIT or Mini Cog Declined: patient has a diagnosis of dementia or cognitive impairment  Advance Directives (For Healthcare) Does Patient Have a Medical Advance Directive?: Yes Type of Advance Directive: Healthcare Power of [place]; Living will; Out of facility DNR (pink MOST or yellow form) Copy of Healthcare Power of Attorney in Chart?: Yes - validated most recent copy scanned in chart (See row information) Copy of Living Will in Chart?: Yes - validated most recent copy scanned in chart (See row information) Out of facility DNR (pink MOST or yellow form) in Chart? (Ambulatory ONLY): Yes - validated most recent copy scanned in chart  Reviewed/Updated  Reviewed/Updated: Reviewed All (Medical, Surgical, Family, Medications, Allergies, Care Teams, Patient Goals); Patient Goals    Allergies (verified) Neosporin [neomycin-bacitracin zn-polymyx] and Penicillins   Current Medications (verified) Outpatient Encounter Medications as of 03/16/2024  Medication Sig   acetaminophen  (TYLENOL ) 325 MG tablet Take 325 mg by mouth every 6 (six) hours as needed for mild pain (pain score 1-3) or headache.   amLODipine  (NORVASC ) 5 MG tablet Take 1 tablet (5 mg total) by mouth daily.   atorvastatin  (LIPITOR) 40 MG tablet TAKE 1 TABLET BY MOUTH  EVERY  DAY   levothyroxine  (SYNTHROID ) 100 MCG tablet TAKE 1 TABLET BY MOUTH EVERY DAY BEFORE BREAKFAST   loperamide (IMODIUM A-D) 2 MG tablet Take 2 mg by mouth as needed for diarrhea or loose stools (Every 24 hours as needed 10 days for covid).   Multiple Vitamins-Minerals (PRESERVISION AREDS PO) Take 1 capsule by mouth 2 (two) times a day.   [DISCONTINUED] dextromethorphan-guaiFENesin (ROBITUSSIN-DM) 10-100 MG/5ML liquid Take by mouth every 6 (six) hours as needed for cough (Covid).   No facility-administered encounter medications on file as of 03/16/2024.    History: Past Medical History:  Diagnosis Date   Cataract 2008   left and right eyde By Dr. 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 Odis, prior polyps, but last due to age   HEMORROIDECTOMY  35   Dr. Rudean Daughters repair of right leg  Right    TOTAL HIP ARTHROPLASTY     Family History  Problem Relation Age of Onset   Aortic aneurysm Father    Social History   Occupational History   Occupation: lawyer  Tobacco Use   Smoking status: Former    Current packs/day: 0.00    Types: Cigarettes    Start date: 02/08/1945    Quit date: 02/09/1968    Years since quitting: 56.1   Smokeless tobacco: Never  Vaping Use   Vaping status: Never Used  Substance and Sexual Activity   Alcohol use: Yes    Alcohol/week: 7.0 standard drinks of alcohol    Types: 7 Shots of liquor per week   Drug use: No   Sexual activity: Not on file   Tobacco Counseling Counseling given: Not Answered  SDOH Screenings   Food Insecurity: Low Risk (09/02/2022)   Received from [facility]   Housing: Low Risk (09/02/2022)   Received from [facility]  Utilities: Low Risk (09/02/2022)   Received from [facility]  Depression (PHQ2-9): Low Risk (09/14/2022)  Tobacco Use: Medium Risk (03/16/2024)   See flowsheets for full screening details  Depression Screen Depression Screening Exception Documentation Depression Screening Exception:: Other- indicate reason in comment box Depression Screening Exception Comment:: HOH and dementia.     Goals Addressed             This Visit's Progress    Optimal Cognitive Function       Evidence-based guidance:  Assess cognitive function using a standardized tool every 6 months or as condition changes.  Perform medication review to identify those that may impact cognitive function such as anticholinergic, opiate, benzodiazepine, digoxin, tricyclic antidepressant, skeletal muscle relaxant or antiepileptic medication.  Consider presence of hypotension or hypoglycemia because of intensive treatment for hypertension and diabetes as contributors to cognitive decline.  Screen for depression using a standardized tool such as the Geriatric Depression Scale.  Optimize visual and auditory function; refer for eye and hearing exam and advocate for treatment that may include eyeglasses, hearing aid or cataract surgery; refer for or provide occupational therapy evaluation.  Provide or refer for individual or group cognitive stimulation therapy, reminiscence therapy, cognitive training or rehabilitation, which may provide  some benefit with memory loss and cognition in early stages of dementia.  Encourage social relationships and physical activity based on ability.  Promote strategies that will preserve the individual's abilities; focus on strengths and quality of life optimization.  Prepare patient and caregiver for introduction of pharmacologic therapy that may include cholinesterase inhibitor or NMDA (N-methyl-d-aspartate) receptor antagonist.  Monitor for  and address medication side effects such as anorexia, weight loss, syncope, bradycardia, falls and fractures.   Notes:              Objective:    Today's Vitals   03/16/24 1122  BP: 112/68  Pulse: 70  Resp: 16  Temp: (!) 97.2 F (36.2 C)  SpO2: 98%  Weight: 141 lb 3.2 oz (64 kg)   Body mass index is 16.74 kg/m.  Hearing/Vision screen No results found. Immunizations and Health Maintenance Health Maintenance  Topic Date Due   DTaP/Tdap/Td (1 - Tdap) Never done   Zoster Vaccines- Shingrix (1 of 2) Never done   Influenza Vaccine  09/09/2023   COVID-19 Vaccine (6 - 2025-26 season) 06/13/2024 (Originally 10/10/2023)   Medicare Annual Wellness (AWV)  03/16/2025   Pneumococcal Vaccine: 50+ Years  Completed   Meningococcal B Vaccine  Aged Out        Assessment/Plan:  This is a routine wellness examination for Airon.  Patient Care Team: Charlanne Fredia CROME, MD as PCP - General (Internal Medicine) Raj Rankin SAUNDERS, MD as Referring Physician (Ophthalmology) Carlie Clark, MD as Consulting Physician (Otolaryngology)  I have personally reviewed and noted the following in the patients chart:   Medical and social history Use of alcohol, tobacco or illicit drugs  Current medications and supplements including opioid prescriptions. Functional ability and status Nutritional status Physical activity Advanced directives List of other physicians Hospitalizations, surgeries, and ER visits in previous 12 months Vitals Screenings to include cognitive, depression, and falls Referrals and appointments  No orders of the defined types were placed in this encounter.  In addition, I have reviewed and discussed with patient certain preventive protocols, quality metrics, and best practice recommendations. A written personalized care plan for preventive services as well as general preventive health recommendations were provided to patient.   Tawni America, NP   03/16/2024   Return in 1  year (on 03/16/2025).  After Visit Summary: [facility] to [facility].   Nurse Notes: worsening CKD, dementia, anemia.  Recently recovered from covid, Tdap vaccine recommended.  "

## 2024-03-16 NOTE — Patient Instructions (Addendum)
 [name],  Thank you for taking the time for your Medicare Wellness Visit. I appreciate your continued commitment to your health goals. Please review the care plan we discussed, and feel free to reach out if I can assist you further.  Please note that Annual Wellness Visits do not include a physical exam. Some assessments may be limited, especially if the visit was conducted virtually. If needed, we may recommend an in-person follow-up with your provider.  Ongoing Care Seeing your primary care provider every 1-2 months helps us  monitor your health and provide consistent, personalized care. Dr Charlanne or Bari America NP  Referrals If a referral was made during today's visit and you haven't received any updates within two weeks, please contact the referred provider directly to check on the status.  Recommended Screenings:  Health Maintenance  Topic Date Due   DTaP/Tdap/Td vaccine (1 - Tdap) Never done   Zoster (Shingles) Vaccine (1 of 2) Never done   Flu Shot  09/09/2023   COVID-19 Vaccine (6 - 2025-26 season) 06/13/2024*   Medicare Annual Wellness Visit  03/16/2025   Pneumococcal Vaccine for age over 58  Completed   Meningitis B Vaccine  Aged Out  *Topic was postponed. The date shown is not the original due date.       03/16/2024   11:24 AM  Advanced Directives  Does Patient Have a Medical Advance Directive? Yes  Type of Estate Agent of [place];Living will;Out of facility DNR (pink MOST or yellow form)  Copy of Healthcare Power of Attorney in Chart? Yes - validated most recent copy scanned in chart (See row information)    Vision: Annual vision screenings are recommended for early detection of glaucoma, cataracts, and diabetic retinopathy. These exams can also reveal signs of chronic conditions such as diabetes and high blood pressure.  Dental: Annual dental screenings help detect early signs of oral cancer, gum disease, and other conditions linked to  overall health, including heart disease and diabetes.  Please see the attached documents for additional preventive care recommendations. NA

## 2024-03-16 NOTE — Progress Notes (Signed)
" ° °  03/16/2024 Name: Stanley Brooks MRN: [id number] DOB: 1925-11-16  Chief Complaint  Patient presents with   Medication Adherence    Atorvastatin     Pharmacy Quality Measure Review  This patient is appearing on a report for being at risk of failing the adherence measure for cholesterol (statin) medications this calendar year.   Medication: Atorvastatin  40 mg  Last fill date: 03/13/24 for 30 day supply  Insurance report was not up to date. No action needed at this time.   Patient lives in a facility and gets his meds filled at [facility].   Cassius DOROTHA Brought, PharmD, BCACP Clinical Pharmacist [phone]   "
